# Patient Record
Sex: Male | Born: 1965 | Race: White | Hispanic: No | Marital: Single | Smoking: Current every day smoker
Health system: Southern US, Community
[De-identification: ages and names within clinical notes are randomized; demographics above are authoritative.]

## PROBLEM LIST (undated history)

## (undated) DIAGNOSIS — R519 Headache, unspecified: Secondary | ICD-10-CM

## (undated) DIAGNOSIS — R51 Headache: Secondary | ICD-10-CM

## (undated) DIAGNOSIS — G894 Chronic pain syndrome: Secondary | ICD-10-CM

## (undated) DIAGNOSIS — I10 Essential (primary) hypertension: Secondary | ICD-10-CM

## (undated) DIAGNOSIS — R569 Unspecified convulsions: Secondary | ICD-10-CM

## (undated) DIAGNOSIS — J45909 Unspecified asthma, uncomplicated: Secondary | ICD-10-CM

## (undated) HISTORY — PX: VASECTOMY: SHX75

## (undated) HISTORY — PX: NOSE SURGERY: SHX723

## (undated) HISTORY — PX: BACK SURGERY: SHX140

## (undated) HISTORY — PX: FOOT SURGERY: SHX648

## (undated) HISTORY — PX: OTHER SURGICAL HISTORY: SHX169

---

## 2016-12-22 ENCOUNTER — Encounter: Payer: Self-pay | Admitting: Student in an Organized Health Care Education/Training Program

## 2016-12-22 ENCOUNTER — Other Ambulatory Visit: Payer: Self-pay

## 2016-12-22 ENCOUNTER — Ambulatory Visit
Payer: Medicaid Other | Attending: Student in an Organized Health Care Education/Training Program | Admitting: Student in an Organized Health Care Education/Training Program

## 2016-12-22 VITALS — BP 113/69 | HR 69 | Temp 98.0°F | Ht 71.0 in | Wt 210.0 lb

## 2016-12-22 DIAGNOSIS — Z79899 Other long term (current) drug therapy: Secondary | ICD-10-CM | POA: Diagnosis not present

## 2016-12-22 DIAGNOSIS — M5136 Other intervertebral disc degeneration, lumbar region: Secondary | ICD-10-CM | POA: Diagnosis not present

## 2016-12-22 DIAGNOSIS — K219 Gastro-esophageal reflux disease without esophagitis: Secondary | ICD-10-CM | POA: Diagnosis not present

## 2016-12-22 DIAGNOSIS — G40909 Epilepsy, unspecified, not intractable, without status epilepticus: Secondary | ICD-10-CM | POA: Diagnosis not present

## 2016-12-22 DIAGNOSIS — M5442 Lumbago with sciatica, left side: Secondary | ICD-10-CM | POA: Diagnosis not present

## 2016-12-22 DIAGNOSIS — M5441 Lumbago with sciatica, right side: Secondary | ICD-10-CM

## 2016-12-22 DIAGNOSIS — F329 Major depressive disorder, single episode, unspecified: Secondary | ICD-10-CM | POA: Insufficient documentation

## 2016-12-22 DIAGNOSIS — J45909 Unspecified asthma, uncomplicated: Secondary | ICD-10-CM | POA: Insufficient documentation

## 2016-12-22 DIAGNOSIS — G8929 Other chronic pain: Secondary | ICD-10-CM

## 2016-12-22 DIAGNOSIS — G894 Chronic pain syndrome: Secondary | ICD-10-CM | POA: Diagnosis not present

## 2016-12-22 DIAGNOSIS — M545 Low back pain: Secondary | ICD-10-CM | POA: Diagnosis present

## 2016-12-22 MED ORDER — GABAPENTIN 600 MG PO TABS: 600.0000 mg | ORAL_TABLET | Freq: Four times a day (QID) | ORAL | 1 refills | Status: DC

## 2016-12-22 NOTE — Patient Instructions (Addendum)
1.  Increase gabapentin to 600 mg 4 times a day. 2.  We will obtain urine drug screen and routine labs today. 3.  Please obtain outside medical records from your pain physician in [place] and [place] and please have them faxed to our clinic.  Before we can definitively discuss treatment options and the treatment plan, I will need your previous procedural history which will include injections performed in your spine along with the medications that she had been on in the past.   You have been given a script for gabapentin today.  You have been  instructed to get labwork today.  Gabapentin capsules or tablets What is this medicine? GABAPENTIN (GA ba pen tin) is used to control partial seizures in adults with epilepsy. It is also used to treat certain types of nerve pain. This medicine may be used for other purposes; ask your health care provider or pharmacist if you have questions. COMMON BRAND NAME(S): Active-PAC with Gabapentin, Gabarone, Neurontin What should I tell my health care provider before I take this medicine? They need to know if you have any of these conditions: -kidney disease -suicidal thoughts, plans, or attempt; a previous suicide attempt by you or a family member -an unusual or allergic reaction to gabapentin, other medicines, foods, dyes, or preservatives -pregnant or trying to get pregnant -breast-feeding How should I use this medicine? Take this medicine by mouth with a glass of water. Follow the directions on the prescription label. You can take it with or without food. If it upsets your stomach, take it with food.Take your medicine at regular intervals. Do not take it more often than directed. Do not stop taking except on your doctor's advice. If you are directed to break the 600 or 800 mg tablets in half as part of your dose, the extra half tablet should be used for the next dose. If you have not used the extra half tablet within 28 days, it should be thrown away. A special  MedGuide will be given to you by the pharmacist with each prescription and refill. Be sure to read this information carefully each time. Talk to your pediatrician regarding the use of this medicine in children. Special care may be needed. Overdosage: If you think you have taken too much of this medicine contact a poison control center or emergency room at once. NOTE: This medicine is only for you. Do not share this medicine with others. What if I miss a dose? If you miss a dose, take it as soon as you can. If it is almost time for your next dose, take only that dose. Do not take double or extra doses. What may interact with this medicine? Do not take this medicine with any of the following medications: -other gabapentin products This medicine may also interact with the following medications: -alcohol -antacids -antihistamines for allergy, cough and cold -certain medicines for anxiety or sleep -certain medicines for depression or psychotic disturbances -homatropine; hydrocodone -naproxen -narcotic medicines (opiates) for pain -phenothiazines like chlorpromazine, mesoridazine, prochlorperazine, thioridazine This list may not describe all possible interactions. Give your health care provider a list of all the medicines, herbs, non-prescription drugs, or dietary supplements you use. Also tell them if you smoke, drink alcohol, or use illegal drugs. Some items may interact with your medicine. What should I watch for while using this medicine? Visit your doctor or health care professional for regular checks on your progress. You may want to keep a record at home of how you feel  your condition is responding to treatment. You may want to share this information with your doctor or health care professional at each visit. You should contact your doctor or health care professional if your seizures get worse or if you have any new types of seizures. Do not stop taking this medicine or any of your seizure  medicines unless instructed by your doctor or health care professional. Stopping your medicine suddenly can increase your seizures or their severity. Wear a medical identification bracelet or chain if you are taking this medicine for seizures, and carry a card that lists all your medications. You may get drowsy, dizzy, or have blurred vision. Do not drive, use machinery, or do anything that needs mental alertness until you know how this medicine affects you. To reduce dizzy or fainting spells, do not sit or stand up quickly, especially if you are an older patient. Alcohol can increase drowsiness and dizziness. Avoid alcoholic drinks. Your mouth may get dry. Chewing sugarless gum or sucking hard candy, and drinking plenty of water will help. The use of this medicine may increase the chance of suicidal thoughts or actions. Pay special attention to how you are responding while on this medicine. Any worsening of mood, or thoughts of suicide or dying should be reported to your health care professional right away. Women who become pregnant while using this medicine may enroll in the [place] Pregnancy Registry by calling [phone]. This registry collects information about the safety of antiepileptic drug use during pregnancy. What side effects may I notice from receiving this medicine? Side effects that you should report to your doctor or health care professional as soon as possible: -allergic reactions like skin rash, itching or hives, swelling of the face, lips, or tongue -worsening of mood, thoughts or actions of suicide or dying Side effects that usually do not require medical attention (report to your doctor or health care professional if they continue or are bothersome): -constipation -difficulty walking or controlling muscle movements -dizziness -nausea -slurred speech -tiredness -tremors -weight gain This list may not describe all possible side effects. Call your  doctor for medical advice about side effects. You may report side effects to FDA at 1-800-FDA-1088. Where should I keep my medicine? Keep out of reach of children. This medicine may cause accidental overdose and death if it taken by other adults, children, or pets. Mix any unused medicine with a substance like cat litter or coffee grounds. Then throw the medicine away in a sealed container like a sealed bag or a coffee can with a lid. Do not use the medicine after the expiration date. Store at room temperature between 15 and 30 degrees C (59 and 86 degrees F). NOTE: This sheet is a summary. It may not cover all possible information. If you have questions about this medicine, talk to your doctor, pharmacist, or health care provider.  2018 [employer] Standard (2013-02-18 15:26:50)

## 2016-12-22 NOTE — Progress Notes (Signed)
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 -Follow-up only after outside medical records from previous pain clinics have been faxed and scanned into our medical record.  Ordered Lab-work, Procedure(s), Referral(s), & Consult(s): Orders Placed This Encounter  Procedures  . Compliance Drug Analysis, Ur  . Comprehensive metabolic panel  . C-reactive protein  . Sedimentation rate  . Vitamin B12  . 25-Hydroxyvitamin D Lcms D2+D3  .  Magnesium   Pharmacotherapy (current): Medications ordered:  Meds ordered this encounter  Medications  . gabapentin (NEURONTIN) 600 MG tablet    Sig: Take 1 tablet (600 mg total) by mouth 4 (four) times daily.    Dispense:  120 tablet    Refill:  1    Do not place this medication, or any other prescription from our practice, on "Automatic Refill". Patient may have prescription filled one day early if pharmacy is closed on scheduled refill date.   Medications administered during this visit: Mathew Robertson had no medications administered during this visit.    Provider-requested follow-up: Return in about 5 weeks (around  01/26/2017).  Future Appointments  Date Time Provider [place]  01/22/2017 11:45 AM Gillis Santa, MD [facility] None    Primary Care Physician: [facility] [place] Location: [facility] Facility Note by: Gillis Santa, M.D, Date: 12/22/2016; Time: 1:16 PM  Patient Instructions  1.  Increase gabapentin to 600 mg 4 times a day. 2.  We will obtain urine drug screen and routine labs today. 3.  Please obtain outside medical records from your pain physician in [place] and [place] and please have them faxed to our clinic.  Before we can definitively discuss treatment options and the treatment plan, I will need your previous procedural history which will include injections performed in your spine along with the medications that she had been on in the past.   You have been given a script for gabapentin today.  You have been  instructed to get labwork today.  Gabapentin capsules or tablets What is this medicine? GABAPENTIN (GA ba pen tin) is used to control partial seizures in adults with epilepsy. It is also used to treat certain types of nerve pain. This medicine may be used for other purposes; ask your health care provider or pharmacist if you have questions. COMMON BRAND NAME(S): Active-PAC with Gabapentin, Gabarone, Neurontin What should I tell my health care provider before I take this medicine? They need to know if you have any of these conditions: -kidney disease -suicidal thoughts, plans, or attempt; a previous suicide attempt by you or a family member -an unusual or allergic reaction to gabapentin, other medicines, foods, dyes, or preservatives -pregnant or trying to get pregnant -breast-feeding How should I use this medicine? Take this medicine by mouth with a glass of water. Follow the directions on the prescription label. You can take it with or without food. If it upsets your stomach, take it with food.Take your medicine at regular  intervals. Do not take it more often than directed. Do not stop taking except on your doctor's advice. If you are directed to break the 600 or 800 mg tablets in half as part of your dose, the extra half tablet should be used for the next dose. If you have not used the extra half tablet within 28 days, it should be thrown away. A special MedGuide will be given to you by the pharmacist with each prescription and refill. Be sure to read this information carefully each time. Talk to your pediatrician regarding the use of this medicine in children. Special care may be needed. Overdosage: If you think you have taken too much of this medicine contact a poison control center or emergency room at once. NOTE: This medicine is only for you. Do not share this medicine with others. What if I miss a dose? If you miss a dose, take it as soon as you can. If it is almost  time for your next dose, take only that dose. Do not take double or extra doses. What may interact with this medicine? Do not take this medicine with any of the following medications: -other gabapentin products This medicine may also interact with the following medications: -alcohol -antacids -antihistamines for allergy, cough and cold -certain medicines for anxiety or sleep -certain medicines for depression or psychotic disturbances -homatropine; hydrocodone -naproxen -narcotic medicines (opiates) for pain -phenothiazines like chlorpromazine, mesoridazine, prochlorperazine, thioridazine This list may not describe all possible interactions. Give your health care provider a list of all the medicines, herbs, non-prescription drugs, or dietary supplements you use. Also tell them if you smoke, drink alcohol, or use illegal drugs. Some items may interact with your medicine. What should I watch for while using this medicine? Visit your doctor or health care professional for regular checks on your progress. You may want to keep a record at home of  how you feel your condition is responding to treatment. You may want to share this information with your doctor or health care professional at each visit. You should contact your doctor or health care professional if your seizures get worse or if you have any new types of seizures. Do not stop taking this medicine or any of your seizure medicines unless instructed by your doctor or health care professional. Stopping your medicine suddenly can increase your seizures or their severity. Wear a medical identification bracelet or chain if you are taking this medicine for seizures, and carry a card that lists all your medications. You may get drowsy, dizzy, or have blurred vision. Do not drive, use machinery, or do anything that needs mental alertness until you know how this medicine affects you. To reduce dizzy or fainting spells, do not sit or stand up quickly, especially if you are an older patient. Alcohol can increase drowsiness and dizziness. Avoid alcoholic drinks. Your mouth may get dry. Chewing sugarless gum or sucking hard candy, and drinking plenty of water will help. The use of this medicine may increase the chance of suicidal thoughts or actions. Pay special attention to how you are responding while on this medicine. Any worsening of mood, or thoughts of suicide or dying should be reported to your health care professional right away. Women who become pregnant while using this medicine may enroll in the [place] Pregnancy Registry by calling [phone]. This registry collects information about the safety of antiepileptic drug use during pregnancy. What side effects may I notice from receiving this medicine? Side effects that you should report to your doctor or health care professional as soon as possible: -allergic reactions like skin rash, itching or hives, swelling of the face, lips, or tongue -worsening of mood, thoughts or actions of suicide or dying Side effects that  usually do not require medical attention (report to your doctor or health care professional if they continue or are bothersome): -constipation -difficulty walking or controlling muscle movements -dizziness -nausea -slurred speech -tiredness -tremors -weight gain This list may not describe all possible side effects. Call your doctor for medical advice about side effects. You may report side effects to FDA at 1-800-FDA-1088. Where should I keep my medicine? Keep out of reach of children. This medicine may cause accidental overdose and death if it taken by other adults, children, or pets. Mix any unused medicine with a substance like cat litter or coffee grounds. Then throw the medicine away in a sealed container like a sealed bag or a coffee can with a  lid. Do not use the medicine after the expiration date. Store at room temperature between 15 and 30 degrees C (59 and 86 degrees F). NOTE: This sheet is a summary. It may not cover all possible information. If you have questions about this medicine, talk to your doctor, pharmacist, or health care provider.  2018 Elsevier/Gold Standard (2013-02-18 15:26:50)

## 2016-12-23 ENCOUNTER — Telehealth: Payer: Self-pay

## 2016-12-23 DIAGNOSIS — M5442 Lumbago with sciatica, left side: Secondary | ICD-10-CM

## 2016-12-23 DIAGNOSIS — M5441 Lumbago with sciatica, right side: Secondary | ICD-10-CM

## 2016-12-23 DIAGNOSIS — G894 Chronic pain syndrome: Secondary | ICD-10-CM | POA: Insufficient documentation

## 2016-12-23 DIAGNOSIS — M5136 Other intervertebral disc degeneration, lumbar region: Secondary | ICD-10-CM | POA: Insufficient documentation

## 2016-12-23 DIAGNOSIS — M51369 Other intervertebral disc degeneration, lumbar region without mention of lumbar back pain or lower extremity pain: Secondary | ICD-10-CM | POA: Insufficient documentation

## 2016-12-23 DIAGNOSIS — G8929 Other chronic pain: Secondary | ICD-10-CM | POA: Insufficient documentation

## 2016-12-23 NOTE — Telephone Encounter (Signed)
Patient called and said Dr. Holley Raring wanted him to sign consents to get records from his previous doctor. He said he didn't sign consents here and wanted [place] to mail them. I told him we could not do that, he needs to come back and sign consents at his convenience. He agreed to do so.

## 2016-12-26 LAB — 25-HYDROXY VITAMIN D LCMS D2+D3
25-Hydroxy, Vitamin D-2: <1 ng/mL
25-Hydroxy, Vitamin D-3: 25 ng/mL

## 2016-12-26 LAB — COMPREHENSIVE METABOLIC PANEL
A/G RATIO: 1.8 (ref 1.2–2.2)
ALBUMIN: 4.8 g/dL (ref 3.5–5.5)
ALK PHOS: 115 IU/L (ref 39–117)
ALT: 12 IU/L (ref 0–44)
AST: 16 IU/L (ref 0–40)
BILIRUBIN TOTAL: 0.2 mg/dL (ref 0.0–1.2)
BUN / CREAT RATIO: 20 (ref 9–20)
BUN: 22 mg/dL (ref 6–24)
CHLORIDE: 104 mmol/L (ref 96–106)
CO2: 23 mmol/L (ref 20–29)
Calcium: 9.1 mg/dL (ref 8.7–10.2)
Creatinine, Ser: 1.12 mg/dL (ref 0.76–1.27)
GFR calc Af Amer: 87 mL/min/{1.73_m2} (ref >59)
GFR calc non Af Amer: 76 mL/min/{1.73_m2} (ref >59)
GLUCOSE: 96 mg/dL (ref 65–99)
Globulin, Total: 2.7 g/dL (ref 1.5–4.5)
POTASSIUM: 3.8 mmol/L (ref 3.5–5.2)
Sodium: 146 mmol/L — ABNORMAL HIGH (ref 134–144)
Total Protein: 7.5 g/dL (ref 6.0–8.5)

## 2016-12-26 LAB — MAGNESIUM: MAGNESIUM: 2.2 mg/dL (ref 1.6–2.3)

## 2016-12-26 LAB — 25-HYDROXYVITAMIN D LCMS D2+D3: 25-HYDROXY, VITAMIN D: 25 ng/mL — AB

## 2016-12-26 LAB — COMPLIANCE DRUG ANALYSIS, UR

## 2016-12-26 LAB — C-REACTIVE PROTEIN: CRP: 3.7 mg/L (ref 0.0–4.9)

## 2016-12-26 LAB — SEDIMENTATION RATE: Sed Rate: 12 mm/hr (ref 0–30)

## 2016-12-26 LAB — VITAMIN B12: Vitamin B-12: 280 pg/mL (ref 232–1245)

## 2017-01-15 ENCOUNTER — Ambulatory Visit: Payer: Medicaid Other | Attending: Neurology

## 2017-01-15 DIAGNOSIS — G4700 Insomnia, unspecified: Secondary | ICD-10-CM | POA: Diagnosis present

## 2017-01-15 DIAGNOSIS — G4733 Obstructive sleep apnea (adult) (pediatric): Secondary | ICD-10-CM | POA: Diagnosis not present

## 2017-01-22 ENCOUNTER — Encounter: Payer: Medicaid Other | Admitting: Student in an Organized Health Care Education/Training Program

## 2017-02-24 ENCOUNTER — Ambulatory Visit
Payer: Medicaid Other | Attending: Student in an Organized Health Care Education/Training Program | Admitting: Student in an Organized Health Care Education/Training Program

## 2017-06-05 ENCOUNTER — Other Ambulatory Visit: Payer: Self-pay

## 2017-06-05 ENCOUNTER — Ambulatory Visit: Payer: Medicaid Other | Admitting: Anesthesiology

## 2017-06-05 ENCOUNTER — Encounter: Payer: Self-pay | Admitting: *Deleted

## 2017-06-05 ENCOUNTER — Ambulatory Visit
Admission: RE | Admit: 2017-06-05 | Discharge: 2017-06-05 | Disposition: A | Discharge status: Home / Self Care | Payer: Medicaid Other | Source: Ambulatory Visit | Attending: Unknown Physician Specialty | Admitting: Unknown Physician Specialty

## 2017-06-05 ENCOUNTER — Encounter
Admission: RE | Disposition: A | Discharge status: Home / Self Care | Payer: Self-pay | Source: Ambulatory Visit | Attending: Unknown Physician Specialty

## 2017-06-05 DIAGNOSIS — Z7982 Long term (current) use of aspirin: Secondary | ICD-10-CM | POA: Diagnosis not present

## 2017-06-05 DIAGNOSIS — Z882 Allergy status to sulfonamides status: Secondary | ICD-10-CM | POA: Diagnosis not present

## 2017-06-05 DIAGNOSIS — K766 Portal hypertension: Secondary | ICD-10-CM | POA: Insufficient documentation

## 2017-06-05 DIAGNOSIS — Z13810 Encounter for screening for upper gastrointestinal disorder: Secondary | ICD-10-CM | POA: Insufficient documentation

## 2017-06-05 DIAGNOSIS — F17210 Nicotine dependence, cigarettes, uncomplicated: Secondary | ICD-10-CM | POA: Diagnosis not present

## 2017-06-05 DIAGNOSIS — Z7951 Long term (current) use of inhaled steroids: Secondary | ICD-10-CM | POA: Diagnosis not present

## 2017-06-05 DIAGNOSIS — R51 Headache: Secondary | ICD-10-CM | POA: Insufficient documentation

## 2017-06-05 DIAGNOSIS — Z1211 Encounter for screening for malignant neoplasm of colon: Secondary | ICD-10-CM | POA: Insufficient documentation

## 2017-06-05 DIAGNOSIS — J449 Chronic obstructive pulmonary disease, unspecified: Secondary | ICD-10-CM | POA: Insufficient documentation

## 2017-06-05 DIAGNOSIS — Z8371 Family history of colonic polyps: Secondary | ICD-10-CM | POA: Diagnosis present

## 2017-06-05 DIAGNOSIS — R569 Unspecified convulsions: Secondary | ICD-10-CM | POA: Insufficient documentation

## 2017-06-05 DIAGNOSIS — Z79899 Other long term (current) drug therapy: Secondary | ICD-10-CM | POA: Insufficient documentation

## 2017-06-05 DIAGNOSIS — Z9852 Vasectomy status: Secondary | ICD-10-CM | POA: Insufficient documentation

## 2017-06-05 DIAGNOSIS — B9681 Helicobacter pylori [H. pylori] as the cause of diseases classified elsewhere: Secondary | ICD-10-CM | POA: Insufficient documentation

## 2017-06-05 DIAGNOSIS — Z888 Allergy status to other drugs, medicaments and biological substances status: Secondary | ICD-10-CM | POA: Insufficient documentation

## 2017-06-05 DIAGNOSIS — D122 Benign neoplasm of ascending colon: Secondary | ICD-10-CM | POA: Diagnosis not present

## 2017-06-05 DIAGNOSIS — K2950 Unspecified chronic gastritis without bleeding: Secondary | ICD-10-CM | POA: Diagnosis not present

## 2017-06-05 DIAGNOSIS — I10 Essential (primary) hypertension: Secondary | ICD-10-CM | POA: Diagnosis not present

## 2017-06-05 DIAGNOSIS — K640 First degree hemorrhoids: Secondary | ICD-10-CM | POA: Diagnosis not present

## 2017-06-05 HISTORY — DX: Unspecified asthma, uncomplicated: J45.909

## 2017-06-05 HISTORY — DX: Essential (primary) hypertension: I10

## 2017-06-05 HISTORY — DX: Headache: R51

## 2017-06-05 HISTORY — PX: COLONOSCOPY WITH PROPOFOL: SHX5780

## 2017-06-05 HISTORY — PX: ESOPHAGOGASTRODUODENOSCOPY (EGD) WITH PROPOFOL: SHX5813

## 2017-06-05 HISTORY — DX: Unspecified convulsions: R56.9

## 2017-06-05 HISTORY — DX: Headache, unspecified: R51.9

## 2017-06-05 HISTORY — DX: Chronic pain syndrome: G89.4

## 2017-06-05 SURGERY — ESOPHAGOGASTRODUODENOSCOPY (EGD) WITH PROPOFOL
Anesthesia: General

## 2017-06-05 MED ORDER — PROPOFOL 500 MG/50ML IV EMUL
INTRAVENOUS | Status: AC
  Filled 2017-06-05: qty 50

## 2017-06-05 MED ORDER — PROPOFOL 500 MG/50ML IV EMUL
INTRAVENOUS | Status: DC | PRN
  Administered 2017-06-05: 50 ug/kg/min via INTRAVENOUS

## 2017-06-05 MED ORDER — SODIUM CHLORIDE 0.9 % IV SOLN
INTRAVENOUS | Status: DC
  Administered 2017-06-05: 10:00:00 via INTRAVENOUS

## 2017-06-05 MED ORDER — GLYCOPYRROLATE 0.2 MG/ML IJ SOLN
INTRAMUSCULAR | Status: AC
  Filled 2017-06-05: qty 1

## 2017-06-05 MED ORDER — FENTANYL CITRATE (PF) 100 MCG/2ML IJ SOLN
INTRAMUSCULAR | Status: AC
  Filled 2017-06-05: qty 2

## 2017-06-05 MED ORDER — LIDOCAINE HCL (PF) 2 % IJ SOLN
INTRAMUSCULAR | Status: AC
  Filled 2017-06-05: qty 10

## 2017-06-05 MED ORDER — SODIUM CHLORIDE 0.9 % IV SOLN: INTRAVENOUS | Status: DC

## 2017-06-05 MED ORDER — PHENYLEPHRINE HCL 10 MG/ML IJ SOLN
INTRAMUSCULAR | Status: DC | PRN
  Administered 2017-06-05: 200 ug via INTRAVENOUS
  Administered 2017-06-05 (×2): 100 ug via INTRAVENOUS
  Administered 2017-06-05: 400 ug via INTRAVENOUS
  Administered 2017-06-05: 200 ug via INTRAVENOUS

## 2017-06-05 MED ORDER — MIDAZOLAM HCL 5 MG/5ML IJ SOLN
INTRAMUSCULAR | Status: DC | PRN
  Administered 2017-06-05: 2 mg via INTRAVENOUS

## 2017-06-05 MED ORDER — EPHEDRINE SULFATE 50 MG/ML IJ SOLN
INTRAMUSCULAR | Status: DC | PRN
  Administered 2017-06-05: 5 mg via INTRAVENOUS
  Administered 2017-06-05 (×2): 10 mg via INTRAVENOUS
  Administered 2017-06-05: 5 mg via INTRAVENOUS
  Administered 2017-06-05 (×2): 10 mg via INTRAVENOUS

## 2017-06-05 MED ORDER — GLYCOPYRROLATE 0.2 MG/ML IJ SOLN
INTRAMUSCULAR | Status: DC | PRN
  Administered 2017-06-05 (×2): 0.2 mg via INTRAVENOUS

## 2017-06-05 MED ORDER — FENTANYL CITRATE (PF) 100 MCG/2ML IJ SOLN
INTRAMUSCULAR | Status: DC | PRN
  Administered 2017-06-05 (×2): 50 ug via INTRAVENOUS

## 2017-06-05 MED ORDER — IPRATROPIUM-ALBUTEROL 0.5-2.5 (3) MG/3ML IN SOLN
RESPIRATORY_TRACT | Status: AC
  Administered 2017-06-05: 3 mL
  Filled 2017-06-05: qty 3

## 2017-06-05 MED ORDER — MIDAZOLAM HCL 2 MG/2ML IJ SOLN
INTRAMUSCULAR | Status: AC
  Filled 2017-06-05: qty 2

## 2017-06-05 MED ORDER — PROPOFOL 10 MG/ML IV BOLUS
INTRAVENOUS | Status: DC | PRN
  Administered 2017-06-05: 20 mg via INTRAVENOUS
  Administered 2017-06-05: 30 mg via INTRAVENOUS

## 2017-06-05 MED ORDER — LIDOCAINE HCL (PF) 2 % IJ SOLN
INTRAMUSCULAR | Status: DC | PRN
  Administered 2017-06-05: 80 mg

## 2017-06-05 NOTE — Anesthesia Preprocedure Evaluation (Signed)
Anesthesia Evaluation  Patient identified by MRN, date of birth, ID band Patient awake    Reviewed: Allergy & Precautions, H&P , NPO status , Patient's Chart, lab work & pertinent test results  History of Anesthesia Complications (+) history of anesthetic complications (Code with back surgery)  Airway Mallampati: III  TM Distance: >3 FB Neck ROM: limited    Dental  (+) Chipped, Poor Dentition, Missing   Pulmonary neg shortness of breath, asthma , COPD, Current Smoker,           Cardiovascular Exercise Tolerance: Poor hypertension,      Neuro/Psych  Headaches, Seizures -, Well Controlled,   Neuromuscular disease CVA, Residual Symptoms negative psych ROS   GI/Hepatic negative GI ROS, Neg liver ROS, neg GERD  ,  Endo/Other  negative endocrine ROS  Renal/GU negative Renal ROS  negative genitourinary   Musculoskeletal  (+) Arthritis ,   Abdominal   Peds  Hematology negative hematology ROS (+)   Anesthesia Other Findings Past Medical History: No date: Asthma No date: Chronic pain disorder No date: Headache No date: Hypertension No date: Seizures ([place])  Past Surgical History: No date: BACK SURGERY No date: cosmetic eye surgery No date: FOOT SURGERY No date: NOSE SURGERY No date: VASECTOMY  BMI    Body Mass Index:  29.29 kg/m      Reproductive/Obstetrics negative OB ROS                             Anesthesia Physical Anesthesia Plan  ASA: III  Anesthesia Plan: General   Post-op Pain Management:    Induction: Intravenous  PONV Risk Score and Plan: Propofol infusion and TIVA  Airway Management Planned: Natural Airway and Nasal Cannula  Additional Equipment:   Intra-op Plan:   Post-operative Plan:   Informed Consent: I have reviewed the patients History and Physical, chart, labs and discussed the procedure including the risks, benefits and alternatives for the proposed  anesthesia with the patient or authorized representative who has indicated his/her understanding and acceptance.   Dental Advisory Given  Plan Discussed with: Anesthesiologist, CRNA and Surgeon  Anesthesia Plan Comments: (Patient consented for risks of anesthesia including but not limited to:  - adverse reactions to medications - risk of intubation if required - damage to teeth, lips or other oral mucosa - sore throat or hoarseness - Damage to heart, brain, lungs or loss of life  Patient voiced understanding.)        Anesthesia Quick Evaluation

## 2017-06-05 NOTE — H&P (Signed)
Primary Care Physician:  [facility], [facility] Primary Gastroenterologist:  Dr. Vira Agar  Pre-Procedure History & Physical: HPI:  Mathew Robertson is a 52 y.o. male is here for an endoscopy and colonoscopy.FH of colon polyps in mother and GERD.   Past Medical History:  Diagnosis Date  . Asthma   . Chronic pain disorder   . Headache   . Hypertension   . Seizures ([place])     Past Surgical History:  Procedure Laterality Date  . BACK SURGERY    . cosmetic eye surgery    . FOOT SURGERY    . NOSE SURGERY    . VASECTOMY      Prior to Admission medications   Medication Sig Start Date End Date Taking? Authorizing Provider  acetaminophen-codeine (TYLENOL #3) 300-30 MG tablet Take 1 tablet by mouth every 8 (eight) hours as needed. 12/17/16  Yes [provider]  ADVAIR DISKUS 250-50 MCG/DOSE AEPB Take 1 puff by mouth as needed. 12/16/16  Yes [provider]  ALPRAZolam Duanne Moron) 1 MG tablet Take 1 tablet by mouth 3 (three) times daily. 12/06/16  Yes [provider]  ASPIRIN LOW DOSE 81 MG EC tablet Take 1 tablet by mouth daily. 11/26/16  Yes [provider]  gabapentin (NEURONTIN) 600 MG tablet Take 1 tablet (600 mg total) by mouth 4 (four) times daily. 12/22/16  Yes Gillis Santa, MD  hydrochlorothiazide (HYDRODIURIL) 25 MG tablet Take 1 tablet by mouth daily. 11/26/16  Yes [provider]  nortriptyline (PAMELOR) 25 MG capsule Take 2 capsules by mouth at bedtime as needed. 11/26/16  Yes [provider]  Oxcarbazepine (TRILEPTAL) 300 MG tablet Take 300 mg by mouth 2 (two) times daily.   Yes [provider]  PROAIR HFA 108 (90 Base) MCG/ACT inhaler Take 2 puffs by mouth 4 (four) times daily as needed. 11/26/16  Yes [provider]  topiramate (TOPAMAX) 50 MG tablet Take 100 mg by mouth 2 (two) times daily. 1 tablet in the am and 2 tablets nightly. 11/26/16  Yes [provider]  VIMPAT 200 MG TABS tablet  Take 1 tablet by mouth 2 (two) times daily. 12/01/16  Yes [provider]    Allergies as of 04/29/2017 - Review Complete 12/22/2016  Allergen Reaction Noted  . Dilantin [phenytoin]  12/22/2016  . Sulfa antibiotics  12/22/2016    Family History  Problem Relation Age of Onset  . Arthritis Mother   . Cancer Mother   . Mental illness Mother     Social History   Socioeconomic History  . Marital status: Single    Spouse name: Not on file  . Number of children: Not on file  . Years of education: Not on file  . Highest education level: Not on file  Occupational History  . Not on file  Social Needs  . Financial resource strain: Not on file  . Food insecurity:    Worry: Not on file    Inability: Not on file  . Transportation needs:    Medical: Not on file    Non-medical: Not on file  Tobacco Use  . Smoking status: Current Every Day Smoker    Packs/day: 0.25    Years: 20.00    Pack years: 5.00    Types: Cigarettes  . Smokeless tobacco: Never Used  Substance and Sexual Activity  . Alcohol use: Not Currently  . Drug use: Never  . Sexual activity: Not on file  Lifestyle  . Physical activity:  Days per week: Not on file    Minutes per session: Not on file  . Stress: Not on file  Relationships  . Social connections:    Talks on phone: Not on file    Gets together: Not on file    Attends religious service: Not on file    Active member of club or organization: Not on file    Attends meetings of clubs or organizations: Not on file    Relationship status: Not on file  . Intimate partner violence:    Fear of current or ex partner: Not on file    Emotionally abused: Not on file    Physically abused: Not on file    Forced sexual activity: Not on file  Other Topics Concern  . Not on file  Social History Narrative  . Not on file    Review of Systems: See HPI, otherwise negative ROS  Physical Exam: BP 123/79   Pulse (!) 52   Temp (!) 97.2 F (36.2 C)  (Tympanic)   Resp 18   Ht 5\' 11"  (1.803 m)   Wt 95.3 kg (210 lb)   SpO2 98%   BMI 29.29 kg/m  General:   Alert,  pleasant and cooperative in NAD Head:  Normocephalic and atraumatic. Neck:  Supple; no masses or thyromegaly. Lungs:  Clear throughout to auscultation.    Heart:  Regular rate and rhythm. Abdomen:  Soft, nontender and nondistended. Normal bowel sounds, without guarding, and without rebound.   Neurologic:  Alert and  oriented x4;  grossly normal neurologically.  Impression/Plan: Mathew Robertson is here for an endoscopy and colonoscopy to be performed for FH colon polyps in mother and GERD.  Risks, benefits, limitations, and alternatives regarding  endoscopy and colonoscopy have been reviewed with the patient.  Questions have been answered.  All parties agreeable.   Gaylyn Cheers, MD  06/05/2017, 10:33 AM

## 2017-06-05 NOTE — Transfer of Care (Signed)
Immediate Anesthesia Transfer of Care Note  Patient: Mathew Robertson  Procedure(s) Performed: ESOPHAGOGASTRODUODENOSCOPY (EGD) WITH PROPOFOL (N/A ) COLONOSCOPY WITH PROPOFOL (N/A )  Patient Location: PACU  Anesthesia Type:General  Level of Consciousness: sedated  Airway & Oxygen Therapy: Patient Spontanous Breathing and Patient connected to nasal cannula oxygen  Post-op Assessment: Report given to RN and Post -op Vital signs reviewed and stable  Post vital signs: Reviewed and stable  Last Vitals:  Vitals Value Taken Time  BP 119/79 06/05/2017 11:20 AM  Temp 36 C 06/05/2017 11:10 AM  Pulse 62 06/05/2017 11:21 AM  Resp 19 06/05/2017 11:21 AM  SpO2 96 % 06/05/2017 11:21 AM  Vitals shown include unvalidated device data.  Last Pain:  Vitals:   06/05/17 1110  TempSrc: Tympanic  PainSc:          Complications: No apparent anesthesia complications

## 2017-06-05 NOTE — Anesthesia Post-op Follow-up Note (Signed)
Anesthesia QCDR form completed.        

## 2017-06-05 NOTE — Anesthesia Postprocedure Evaluation (Signed)
Anesthesia Post Note  Patient: Luian Schumpert  Procedure(s) Performed: ESOPHAGOGASTRODUODENOSCOPY (EGD) WITH PROPOFOL (N/A ) COLONOSCOPY WITH PROPOFOL (N/A )  Patient location during evaluation: Endoscopy Anesthesia Type: General Level of consciousness: awake and alert Pain management: pain level controlled Vital Signs Assessment: post-procedure vital signs reviewed and stable Respiratory status: spontaneous breathing, nonlabored ventilation, respiratory function stable and patient connected to nasal cannula oxygen Cardiovascular status: blood pressure returned to baseline and stable Postop Assessment: no apparent nausea or vomiting Anesthetic complications: no     Last Vitals:  Vitals:   06/05/17 1110 06/05/17 1120  BP:  119/79  Pulse: 67 65  Resp: 19 18  Temp: (!) 36 C   SpO2: 96% 96%    Last Pain:  Vitals:   06/05/17 1110  TempSrc: Tympanic  PainSc:                  Precious Haws Piscitello

## 2017-06-05 NOTE — Op Note (Signed)
[facility] Gastroenterology Patient Name: Mathew Robertson Procedure Date: 06/05/2017 10:35 AM MRN: [id number] Account #: [id number] Date of Birth: [birth year] Admit Type: Outpatient Age: 52 Room: [facility] ENDO ROOM 1 Gender: Male Note Status: Finalized Procedure:            Colonoscopy Indications:          Colon cancer screening in patient at increased risk:                        Family history of 1st-degree relative with colon polyps Providers:            Manya Silvas, MD Referring MD:         No Local Md, MD (Referring MD) Medicines:            Propofol per Anesthesia Complications:        No immediate complications. Procedure:            Pre-Anesthesia Assessment:                       - After reviewing the risks and benefits, the patient                        was deemed in satisfactory condition to undergo the                        procedure.                       After obtaining informed consent, the colonoscope was                        passed under direct vision. Throughout the procedure,                        the patient's blood pressure, pulse, and oxygen                        saturations were monitored continuously. The                        Colonoscope was introduced through the anus and                        advanced to the the cecum, identified by appendiceal                        orifice and ileocecal valve. The colonoscopy was                        performed without difficulty. The patient tolerated the                        procedure well. The quality of the bowel preparation                        was excellent. Findings:      A diminutive polyp was found in the ascending colon. The polyp was       sessile. The polyp was removed with a jumbo cold forceps. Resection and       retrieval were complete.  Internal hemorrhoids were found during endoscopy. The hemorrhoids were       small and Grade I (internal hemorrhoids that do not  prolapse).      The exam was otherwise without abnormality. Impression:           - One diminutive polyp in the ascending colon, removed                        with a jumbo cold forceps. Resected and retrieved.                       - Internal hemorrhoids.                       - The examination was otherwise normal. Recommendation:       - Await pathology results. Manya Silvas, MD 06/05/2017 11:17:57 AM This report has been signed electronically. Number of Addenda: 0 Note Initiated On: 06/05/2017 10:35 AM Scope Withdrawal Time: 0 hours 11 minutes 11 seconds  Total Procedure Duration: 0 hours 15 minutes 50 seconds       [facility]

## 2017-06-05 NOTE — Op Note (Signed)
[facility] Gastroenterology Patient Name: Mathew Robertson Procedure Date: 06/05/2017 10:36 AM MRN: [id number] Account #: [id number] Date of Birth: March 05, 1965 Admit Type: Outpatient Age: 52 Room: [facility] ENDO ROOM 1 Gender: Male Note Status: Finalized Procedure:            Upper GI endoscopy Indications:          Suspected gastro-esophageal reflux disease Providers:            Manya Silvas, MD Referring MD:         No Local Md, MD (Referring MD) Medicines:            Propofol per Anesthesia Complications:        No immediate complications. Procedure:            Pre-Anesthesia Assessment:                       - After reviewing the risks and benefits, the patient                        was deemed in satisfactory condition to undergo the                        procedure.                       After obtaining informed consent, the endoscope was                        passed under direct vision. Throughout the procedure,                        the patient's blood pressure, pulse, and oxygen                        saturations were monitored continuously. The Endoscope                        was introduced through the mouth, and advanced to the                        second part of duodenum. The upper GI endoscopy was                        accomplished without difficulty. The patient tolerated                        the procedure well. Findings:      The examined esophagus was normal. GEJ 44cm from teeth.      Diffuse moderate inflammation characterized by congestion (edema),       erosions, erythema and granularity was found in the gastric body. This       is portal hypertensive gastrophathy.      Patchy mild inflammation characterized by erythema and granularity was       found in the gastric antrum. Biopsies were taken with a cold forceps for       histology. Biopsies were taken with a cold forceps for Helicobacter       pylori testing.      The examined duodenum  was normal. Impression:           - Normal esophagus.                       -  Gastritis.                       - Gastritis. Biopsied.                       - Normal examined duodenum. Recommendation:       - Await pathology results. Manya Silvas, MD 06/05/2017 10:56:16 AM This report has been signed electronically. Number of Addenda: 0 Note Initiated On: 06/05/2017 10:36 AM      [facility]

## 2017-06-08 ENCOUNTER — Encounter: Payer: Self-pay | Admitting: Unknown Physician Specialty

## 2017-06-08 LAB — SURGICAL PATHOLOGY

## 2017-06-30 ENCOUNTER — Encounter: Payer: Self-pay | Admitting: Emergency Medicine

## 2017-06-30 ENCOUNTER — Emergency Department
Admission: EM | Admit: 2017-06-30 | Discharge: 2017-06-30 | Disposition: A | Discharge status: Home / Self Care | Payer: Medicaid Other | Attending: Emergency Medicine | Admitting: Emergency Medicine

## 2017-06-30 ENCOUNTER — Other Ambulatory Visit: Payer: Self-pay

## 2017-06-30 DIAGNOSIS — Z5321 Procedure and treatment not carried out due to patient leaving prior to being seen by health care provider: Secondary | ICD-10-CM | POA: Insufficient documentation

## 2017-06-30 DIAGNOSIS — R51 Headache: Secondary | ICD-10-CM | POA: Diagnosis not present

## 2017-06-30 NOTE — ED Triage Notes (Signed)
Pt ambulatory to triage with no difficulty. Pt reports hx of seizures. Last seizure was in Feb of 2018. Pt has been taking his medications as prescribed. Tonight bout 2 hours ago he developed a headache. Took nortriptyline as he normally does but has not help. Followed by Dr Melrose Nakayama at [facility] who has told him not to take any OTC meds for headaches. Pt talking in full and complete sentences with no difficulty.

## 2017-08-09 ENCOUNTER — Encounter: Payer: Self-pay | Admitting: Emergency Medicine

## 2017-08-09 ENCOUNTER — Other Ambulatory Visit: Payer: Self-pay

## 2017-08-09 ENCOUNTER — Emergency Department
Admission: EM | Admit: 2017-08-09 | Discharge: 2017-08-09 | Disposition: A | Discharge status: Home / Self Care | Payer: Medicaid Other | Attending: Emergency Medicine | Admitting: Emergency Medicine

## 2017-08-09 DIAGNOSIS — M7918 Myalgia, other site: Secondary | ICD-10-CM | POA: Insufficient documentation

## 2017-08-09 DIAGNOSIS — M542 Cervicalgia: Secondary | ICD-10-CM | POA: Diagnosis present

## 2017-08-09 DIAGNOSIS — F17210 Nicotine dependence, cigarettes, uncomplicated: Secondary | ICD-10-CM | POA: Diagnosis not present

## 2017-08-09 DIAGNOSIS — I10 Essential (primary) hypertension: Secondary | ICD-10-CM | POA: Insufficient documentation

## 2017-08-09 DIAGNOSIS — J45909 Unspecified asthma, uncomplicated: Secondary | ICD-10-CM | POA: Diagnosis not present

## 2017-08-09 DIAGNOSIS — Z79899 Other long term (current) drug therapy: Secondary | ICD-10-CM | POA: Insufficient documentation

## 2017-08-09 MED ORDER — METHOCARBAMOL 500 MG PO TABS
500.0000 mg | ORAL_TABLET | Freq: Once | ORAL | Status: AC
  Administered 2017-08-09: 500 mg via ORAL
  Filled 2017-08-09: qty 1

## 2017-08-09 MED ORDER — KETOROLAC TROMETHAMINE 30 MG/ML IJ SOLN
30.0000 mg | Freq: Once | INTRAMUSCULAR | Status: AC
  Administered 2017-08-09: 30 mg via INTRAMUSCULAR
  Filled 2017-08-09: qty 1

## 2017-08-09 MED ORDER — METHOCARBAMOL 500 MG PO TABS: 500.0000 mg | ORAL_TABLET | Freq: Four times a day (QID) | ORAL | 0 refills | Status: DC

## 2017-08-09 MED ORDER — NAPROXEN 500 MG PO TABS: 500.0000 mg | ORAL_TABLET | Freq: Two times a day (BID) | ORAL | 0 refills | Status: DC

## 2017-08-09 NOTE — Discharge Instructions (Signed)
Keep your scheduled appointment on the eighth with your doctor.  You may use ice or heat to your neck as needed for discomfort.  Continue taking methocarbamol 500 mg 4 times daily for muscle spasms and naproxen 500 mg twice daily with food.  Continue your regular medication as prescribed by your doctor.

## 2017-08-09 NOTE — ED Provider Notes (Signed)
[facility] Emergency Department Provider Note  ___________________________________________   First MD Initiated Contact with Patient 08/09/17 [phone]     (approximate)  I have reviewed the triage vital signs and the nursing notes.   HISTORY  Chief Complaint Neck Pain  HPI Mathew Robertson is a 52 y.o. male is here with complaint of neck and upper back pain for the last 2 weeks.  Patient has history of chronic low back pain with sciatica.  He states that he also sees a pain management doctor, neurologist and his primary care provider.  Does not injury to his neck.  Currently the only medication he is on for pain has been gabapentin which is not helping.  He denies any paresthesias into his upper or lower extremities at this time.   Past Medical History:  Diagnosis Date  . Asthma   . Chronic pain disorder   . Headache   . Hypertension   . Seizures [facility])     Patient Active Problem List   Diagnosis Date Noted  . Chronic bilateral low back pain with bilateral sciatica 12/23/2016  . Lumbar degenerative disc disease 12/23/2016  . Chronic pain syndrome 12/23/2016    Past Surgical History:  Procedure Laterality Date  . BACK SURGERY    . COLONOSCOPY WITH PROPOFOL N/A 06/05/2017   Procedure: COLONOSCOPY WITH PROPOFOL;  Surgeon: Manya Silvas, MD;  Location: [facility] ENDOSCOPY;  Service: Endoscopy;  Laterality: N/A;  . cosmetic eye surgery    . ESOPHAGOGASTRODUODENOSCOPY (EGD) WITH PROPOFOL N/A 06/05/2017   Procedure: ESOPHAGOGASTRODUODENOSCOPY (EGD) WITH PROPOFOL;  Surgeon: Manya Silvas, MD;  Location: [facility] ENDOSCOPY;  Service: Endoscopy;  Laterality: N/A;  . FOOT SURGERY    . NOSE SURGERY    . VASECTOMY      Prior to Admission medications   Medication Sig Start Date End Date Taking? Authorizing Provider  acetaminophen-codeine (TYLENOL #3) 300-30 MG tablet Take 1 tablet by mouth every 8 (eight) hours as needed. 12/17/16   [provider]  ADVAIR  DISKUS 250-50 MCG/DOSE AEPB Take 1 puff by mouth as needed. 12/16/16   [provider]  ALPRAZolam Duanne Moron) 1 MG tablet Take 1 tablet by mouth 3 (three) times daily. 12/06/16   [provider]  ASPIRIN LOW DOSE 81 MG EC tablet Take 1 tablet by mouth daily. 11/26/16   [provider]  gabapentin (NEURONTIN) 600 MG tablet Take 1 tablet (600 mg total) by mouth 4 (four) times daily. 12/22/16   Gillis Santa, MD  hydrochlorothiazide (HYDRODIURIL) 25 MG tablet Take 1 tablet by mouth daily. 11/26/16   [provider]  methocarbamol (ROBAXIN) 500 MG tablet Take 1 tablet (500 mg total) by mouth 4 (four) times daily. 08/09/17   [name], PA-C  naproxen (NAPROSYN) 500 MG tablet Take 1 tablet (500 mg total) by mouth 2 (two) times daily with a meal. 08/09/17   [name], PA-C  nortriptyline (PAMELOR) 25 MG capsule Take 2 capsules by mouth at bedtime as needed. 11/26/16   [provider]  Oxcarbazepine (TRILEPTAL) 300 MG tablet Take 300 mg by mouth 2 (two) times daily.    [provider]  PROAIR HFA 108 [phone] Base) MCG/ACT inhaler Take 2 puffs by mouth 4 (four) times daily as needed. 11/26/16   [provider]  topiramate (TOPAMAX) 50 MG tablet Take 100 mg by mouth 2 (two) times daily. 1 tablet in the am and 2 tablets nightly. 11/26/16   [provider]  VIMPAT  200 MG TABS tablet Take 1 tablet by mouth 2 (two) times daily. 12/01/16   [provider]    Allergies Dilantin [phenytoin] and Sulfa antibiotics  Family History  Problem Relation Age of Onset  . Arthritis Mother   . Cancer Mother   . Mental illness Mother     Social History Social History   Tobacco Use  . Smoking status: Current Every Day Smoker    Packs/day: 0.25    Years: 20.00    Pack years: 5.00    Types: Cigarettes  . Smokeless tobacco: Never Used  Substance Use Topics  . Alcohol use: Not Currently  . Drug use: Never    Review of  Systems Constitutional: No fever/chills Eyes: No visual changes. ENT: No complaints. Cardiovascular: Denies chest pain. Respiratory: Denies shortness of breath. Gastrointestinal:   No nausea, no vomiting.  Genitourinary: Negative for dysuria. Musculoskeletal: Positive for chronic low back pain. Neurological: Negative for headaches, focal weakness or numbness. ____________________________________________   PHYSICAL EXAM:  VITAL SIGNS: ED Triage Vitals [08/09/17 0823]  Enc Vitals Group     BP 120/80     Pulse Rate 72     Resp 16     Temp 97.8 F (36.6 C)     Temp Source Oral     SpO2 97 %     Weight 208 lb (94.3 kg)     Height 5\' 11"  (1.803 m)     Head Circumference      Peak Flow      Pain Score      Pain Loc      Pain Edu?      Excl. in [place]?    Constitutional: Alert and oriented. Well appearing and in no acute distress. Eyes: Conjunctivae are normal. Head: Atraumatic. Neck: No stridor.  No point tenderness on palpation of cervical spine posteriorly.  Soft muscle tissue tenderness is noted bilaterally and into the trapezius muscles bilaterally.  Patient is able to move in all 4 planes slowly.  Range of motion increases his pain. Cardiovascular: Normal rate, regular rhythm. Grossly normal heart sounds.  Good peripheral circulation. Respiratory: Normal respiratory effort.  No retractions. Lungs CTAB. Musculoskeletal: Moves upper and lower extremities without any difficulty.  Good muscle strength bilaterally.  Normal gait. Neurologic:  Normal speech and language. No gross focal neurologic deficits are appreciated. No gait instability. Skin:  Skin is warm, dry and intact.  No rash, ecchymosis or abrasions were seen. Psychiatric: Mood and affect are normal. Speech and behavior are normal.  ____________________________________________   LABS (all labs ordered are listed, but only abnormal results are displayed)  Labs Reviewed - No data to  display  PROCEDURES  Procedure(s) performed: None  Procedures  Critical Care performed: No  ____________________________________________   INITIAL IMPRESSION / ASSESSMENT AND PLAN / ED COURSE  As part of my medical decision making, I reviewed the following data within the electronic MEDICAL RECORD NUMBER Notes from prior ED visits and [place] Controlled Substance Database  Patient was given methocarbamol and Toradol while in the emergency department and was improving prior to discharge.  Patient is encouraged to keep his appointment on August 8 with his PCP.  He also states he has an appointment with his pain management doctor at the end of this month.  He was discharged with a prescription for naproxen 5 mg twice daily with food and methocarbamol 500 mg 4 times daily as needed for muscle spasms.  He is to continue taking gabapentin with this  medication.  Is also encouraged to use ice or heat to his neck as needed for comfort. ____________________________________________   FINAL CLINICAL IMPRESSION(S) / ED DIAGNOSES  Final diagnoses:  Musculoskeletal pain     ED Discharge Orders        Ordered    methocarbamol (ROBAXIN) 500 MG tablet  4 times daily     08/09/17 0937    naproxen (NAPROSYN) 500 MG tablet  2 times daily with meals     08/09/17 5885       Note:  This document was prepared using Dragon voice recognition software and may include unintentional dictation errors.    [name], PA-C 08/09/17 1036    Delman Kitten, MD 08/09/17 [phone]

## 2017-08-09 NOTE — ED Notes (Signed)
Pt c/o bilateral pain to upper back and collar bone area x 2 weeks, states somedays the pain is better than others.  Has hx of back surgery in late 90s.

## 2017-08-09 NOTE — ED Triage Notes (Signed)
Neck pain and upper back pain x 2 weeks.

## 2017-08-20 ENCOUNTER — Ambulatory Visit: Payer: Medicaid Other | Admitting: Student in an Organized Health Care Education/Training Program

## 2017-08-25 ENCOUNTER — Ambulatory Visit
Payer: Medicaid Other | Attending: Student in an Organized Health Care Education/Training Program | Admitting: Student in an Organized Health Care Education/Training Program

## 2017-08-25 ENCOUNTER — Encounter: Payer: Self-pay | Admitting: Student in an Organized Health Care Education/Training Program

## 2017-08-25 ENCOUNTER — Other Ambulatory Visit: Payer: Self-pay

## 2017-08-25 VITALS — BP 137/91 | HR 98 | Temp 98.6°F | Resp 18 | Ht 71.0 in | Wt 206.0 lb

## 2017-08-25 DIAGNOSIS — Z791 Long term (current) use of non-steroidal anti-inflammatories (NSAID): Secondary | ICD-10-CM | POA: Diagnosis not present

## 2017-08-25 DIAGNOSIS — M79601 Pain in right arm: Secondary | ICD-10-CM | POA: Diagnosis not present

## 2017-08-25 DIAGNOSIS — Z7984 Long term (current) use of oral hypoglycemic drugs: Secondary | ICD-10-CM | POA: Diagnosis not present

## 2017-08-25 DIAGNOSIS — J45909 Unspecified asthma, uncomplicated: Secondary | ICD-10-CM | POA: Insufficient documentation

## 2017-08-25 DIAGNOSIS — M542 Cervicalgia: Secondary | ICD-10-CM | POA: Diagnosis not present

## 2017-08-25 DIAGNOSIS — I10 Essential (primary) hypertension: Secondary | ICD-10-CM | POA: Diagnosis not present

## 2017-08-25 DIAGNOSIS — Z79891 Long term (current) use of opiate analgesic: Secondary | ICD-10-CM | POA: Diagnosis not present

## 2017-08-25 DIAGNOSIS — Z79899 Other long term (current) drug therapy: Secondary | ICD-10-CM | POA: Diagnosis not present

## 2017-08-25 DIAGNOSIS — M5441 Lumbago with sciatica, right side: Secondary | ICD-10-CM | POA: Diagnosis not present

## 2017-08-25 DIAGNOSIS — G8929 Other chronic pain: Secondary | ICD-10-CM

## 2017-08-25 DIAGNOSIS — Z882 Allergy status to sulfonamides status: Secondary | ICD-10-CM | POA: Diagnosis not present

## 2017-08-25 DIAGNOSIS — F17210 Nicotine dependence, cigarettes, uncomplicated: Secondary | ICD-10-CM | POA: Diagnosis not present

## 2017-08-25 DIAGNOSIS — Z8261 Family history of arthritis: Secondary | ICD-10-CM | POA: Insufficient documentation

## 2017-08-25 DIAGNOSIS — Z5181 Encounter for therapeutic drug level monitoring: Secondary | ICD-10-CM | POA: Diagnosis not present

## 2017-08-25 DIAGNOSIS — Z7982 Long term (current) use of aspirin: Secondary | ICD-10-CM | POA: Insufficient documentation

## 2017-08-25 DIAGNOSIS — M5442 Lumbago with sciatica, left side: Secondary | ICD-10-CM

## 2017-08-25 DIAGNOSIS — M5136 Other intervertebral disc degeneration, lumbar region: Secondary | ICD-10-CM | POA: Insufficient documentation

## 2017-08-25 DIAGNOSIS — Z809 Family history of malignant neoplasm, unspecified: Secondary | ICD-10-CM | POA: Insufficient documentation

## 2017-08-25 DIAGNOSIS — G894 Chronic pain syndrome: Secondary | ICD-10-CM | POA: Insufficient documentation

## 2017-08-25 MED ORDER — ORPHENADRINE CITRATE 30 MG/ML IJ SOLN
INTRAMUSCULAR | Status: AC
  Filled 2017-08-25: qty 2

## 2017-08-25 MED ORDER — ORPHENADRINE CITRATE 30 MG/ML IJ SOLN
60.0000 mg | Freq: Once | INTRAMUSCULAR | Status: AC
  Administered 2017-08-25: 60 mg via INTRAMUSCULAR

## 2017-08-25 MED ORDER — PREGABALIN 50 MG PO CAPS: 50.0000 mg | ORAL_CAPSULE | Freq: Three times a day (TID) | ORAL | 1 refills | Status: DC

## 2017-08-25 NOTE — Patient Instructions (Addendum)
You have been given RX for Lyrica 50mg  with one refill.  Follow up with x-ray of spine and shoulder.

## 2017-08-25 NOTE — Progress Notes (Signed)
Patient's Name: Mathew Robertson  MRN: [id number]  Referring Provider: [facility]  DOB: [birth year]  PCP: [facility]  DOS: 08/25/2017  Note by: Gillis Santa, MD  Service setting: Ambulatory outpatient  Specialty: Interventional Pain Management  Location: [facility] (AMB) Pain Management Facility    Patient type: Established   Primary Reason(s) for Visit: Encounter for prescription drug management. (Level of risk: moderate)  CC: Right neck pain and right arm pain  HPI  Mathew Robertson is a 52 y.o. year old, male patient, who comes today for a medication management evaluation. He has Chronic bilateral low back pain with bilateral sciatica; Lumbar degenerative disc disease; Chronic pain syndrome; and Cervicalgia on their problem list. His primarily concern today is the Right neck pain and right arm pain  Pain Assessment: Location: Right Shoulder Radiating: radiates from right neck to right shoulder, down right arm and fingers  Onset: More than a month ago Duration: Chronic pain Quality: Constant, Sharp, Shooting Severity: 10-Worst pain ever/10 (subjective, self-reported pain score)  Note: Reported level is inconsistent with clinical observations. Clinically the patient looks like a 3/10 A 3/10 is viewed as "Moderate" and described as significantly interfering with activities of daily living (ADL). It becomes difficult to feed, bathe, get dressed, get on and off the toilet or to perform personal hygiene functions. Difficult to get in and out of bed or a chair without assistance. Very distracting. With effort, it can be ignored when deeply involved in activities.       When using our objective Pain Scale, levels between 6 and 10/10 are said to belong in an emergency room, as it progressively worsens from a 6/10, described as severely limiting, requiring emergency care not usually available at an outpatient pain management facility. At a 6/10 level, communication becomes difficult and  requires great effort. Assistance to reach the emergency department may be required. Facial flushing and profuse sweating along with potentially dangerous increases in heart rate and blood pressure will be evident. Effect on ADL: "Unable to pick up anything heavy and can not lift with right arm"  Timing: Constant Modifying factors: Denies  BP: (!) 137/91  HR: 98  Mathew Robertson was last scheduled for an appointment on Visit date not found for medication management. During today's appointment we reviewed Mathew Robertson's chronic pain status, as well as his outpatient medication regimen.  Patient's previous visit with me was in December.  He has not followed up since then.  Patient is endorsing right-sided neck pain that radiates down his right arm, forearm, occasionally down to his fingers.  He is endorsing weakness in his hands.  Patient was unable to get records from his previous pain provider.  He is continuing Xanax daily for anxiety.  Patient did go to the emergency department for right neck pain.  He was given muscle relaxant as well as NSAID and discharged home.  Patient would not be a candidate for chronic opioid therapy given his chronic Xanax use.  We will focus on non-opioid pain management in the form of interventional therapies, physical therapy, non-narcotic-based analgesics.  The patient  reports that he does not use drugs. His body mass index is 28.73 kg/m.  Further details on both, my assessment(s), as well as the proposed treatment plan, please see below.    Laboratory Chemistry  Inflammation Markers (CRP: Acute Phase) (ESR: Chronic Phase) Lab Results  Component Value Date   CRP 3.7 12/22/2016   ESRSEDRATE 12 12/22/2016  Rheumatology Markers No results found for: RF, ANA, LABURIC, URICUR, LYMEIGGIGMAB, LYMEABIGMQN, HLAB27                      Renal Function Markers Lab Results  Component Value Date   BUN 22 12/22/2016   CREATININE 1.12 12/22/2016   BCR  20 12/22/2016   GFRAA 87 12/22/2016   GFRNONAA 76 12/22/2016                             Hepatic Function Markers Lab Results  Component Value Date   AST 16 12/22/2016   ALT 12 12/22/2016   ALBUMIN 4.8 12/22/2016   ALKPHOS 115 12/22/2016                        Electrolytes Lab Results  Component Value Date   NA 146 (H) 12/22/2016   K 3.8 12/22/2016   CL 104 12/22/2016   CALCIUM 9.1 12/22/2016   MG 2.2 12/22/2016                        Neuropathy Markers Lab Results  Component Value Date   VITAMINB12 280 12/22/2016                        Bone Pathology Markers Lab Results  Component Value Date   25OHVITD1 25 (L) 12/22/2016   25OHVITD2 <1.0 12/22/2016   25OHVITD3 25 12/22/2016                         Coagulation Parameters No results found for: INR, LABPROT, APTT, PLT, DDIMER                      Cardiovascular Markers No results found for: BNP, CKTOTAL, CKMB, TROPONINI, HGB, HCT                       CA Markers No results found for: CEA, CA125, LABCA2                      Note: Lab results reviewed.  Recent Diagnostic Imaging Results  No image results found.  Complexity Note: Imaging results reviewed. Results shared with Mathew Robertson, using Layman's terms.                         Meds   Current Outpatient Medications:  .  ADVAIR DISKUS 250-50 MCG/DOSE AEPB, Take 1 puff by mouth as needed., Disp: , Rfl: 0 .  ALPRAZolam (XANAX) 1 MG tablet, Take 1 tablet by mouth 3 (three) times daily., Disp: , Rfl: 0 .  ASPIRIN LOW DOSE 81 MG EC tablet, Take 1 tablet by mouth daily., Disp: , Rfl: 3 .  hydrochlorothiazide (HYDRODIURIL) 25 MG tablet, Take 1 tablet by mouth daily., Disp: , Rfl: 2 .  methocarbamol (ROBAXIN) 500 MG tablet, Take 1 tablet (500 mg total) by mouth 4 (four) times daily., Disp: 12 tablet, Rfl: 0 .  naproxen (NAPROSYN) 500 MG tablet, Take 1 tablet (500 mg total) by mouth 2 (two) times daily with a meal., Disp: 30 tablet, Rfl: 0 .  nortriptyline  (PAMELOR) 25 MG capsule, Take 2 capsules by mouth at bedtime as needed., Disp: , Rfl: 0 .  omeprazole (PRILOSEC) 40 MG capsule, TK ONE  C PO QD, Disp: , Rfl: 0 .  Oxcarbazepine (TRILEPTAL) 300 MG tablet, Take 300 mg by mouth 2 (two) times daily., Disp: , Rfl:  .  PROAIR HFA 108 (90 Base) MCG/ACT inhaler, Take 2 puffs by mouth 4 (four) times daily as needed., Disp: , Rfl: 1 .  topiramate (TOPAMAX) 50 MG tablet, Take 100 mg by mouth 2 (two) times daily. 1 tablet in the am and 2 tablets nightly., Disp: , Rfl: 0 .  VIMPAT 200 MG TABS tablet, Take 1 tablet by mouth 2 (two) times daily., Disp: , Rfl: 0 .  acetaminophen-codeine (TYLENOL #3) 300-30 MG tablet, Take 1 tablet by mouth every 8 (eight) hours as needed., Disp: , Rfl: 0 .  pregabalin (LYRICA) 50 MG capsule, Take 1 capsule (50 mg total) by mouth 3 (three) times daily., Disp: 90 capsule, Rfl: 1  ROS  Constitutional: Denies any fever or chills Gastrointestinal: No reported hemesis, hematochezia, vomiting, or acute GI distress Musculoskeletal: Denies any acute onset joint swelling, redness, loss of ROM, or weakness Neurological: No reported episodes of acute onset apraxia, aphasia, dysarthria, agnosia, amnesia, paralysis, loss of coordination, or loss of consciousness  Allergies  Mathew Robertson is allergic to dilantin [phenytoin] and sulfa antibiotics.  Mathew Robertson  Drug: Mathew Robertson  reports that he does not use drugs. Alcohol:  reports that he drank alcohol. Tobacco:  reports that he has been smoking cigarettes. He has a 5.00 pack-year smoking history. He has never used smokeless tobacco. Medical:  has a past medical history of Asthma, Chronic pain disorder, Headache, Hypertension, and Seizures ([place]). Surgical: Mathew Robertson  has a past surgical history that includes Back surgery; cosmetic eye surgery; Foot surgery; Nose surgery; Vasectomy; Esophagogastroduodenoscopy (egd) with propofol (N/A, 06/05/2017); and Colonoscopy with propofol (N/A, 06/05/2017). Family:  family history includes Arthritis in his mother; Cancer in his mother; Mental illness in his mother.  Constitutional Exam  General appearance: Well nourished, well developed, and well hydrated. In no apparent acute distress Vitals:   08/25/17 1407  BP: (!) 137/91  Pulse: 98  Resp: 18  Temp: 98.6 F (37 C)  SpO2: 98%  Weight: 206 lb (93.4 kg)  Height: '5\' 11"'$  (1.803 m)   BMI Assessment: Estimated body mass index is 28.73 kg/m as calculated from the following:   Height as of this encounter: '5\' 11"'$  (1.803 m).   Weight as of this encounter: 206 lb (93.4 kg).  BMI interpretation table: BMI level Category Range association with higher incidence of chronic pain  <18 kg/m2 Underweight   18.5-24.9 kg/m2 Ideal body weight   25-29.9 kg/m2 Overweight Increased incidence by 20%  30-34.9 kg/m2 Obese (Class I) Increased incidence by 68%  35-39.9 kg/m2 Severe obesity (Class II) Increased incidence by 136%  >40 kg/m2 Extreme obesity (Class III) Increased incidence by 254%   Patient's current BMI Ideal Body weight  Body mass index is 28.73 kg/m. Ideal body weight: 75.3 kg (166 lb 0.1 oz) Adjusted ideal body weight: 82.6 kg (182 lb 0.1 oz)   BMI Readings from Last 4 Encounters:  08/25/17 28.73 kg/m  08/09/17 29.01 kg/m  06/30/17 29.43 kg/m  06/05/17 29.29 kg/m   Wt Readings from Last 4 Encounters:  08/25/17 206 lb (93.4 kg)  08/09/17 208 lb (94.3 kg)  06/30/17 211 lb (95.7 kg)  06/05/17 210 lb (95.3 kg)  Psych/Mental status: Alert, oriented x 3 (person, place, & time)       Eyes: PERLA Respiratory: No evidence of acute respiratory distress  Cervical  Spine Area Exam  Skin & Axial Inspection: No masses, redness, edema, swelling, or associated skin lesions Alignment: Symmetrical Functional ROM: Decreased ROM, to the right Stability: No instability detected Muscle Tone/Strength: Functionally intact. No obvious neuro-muscular anomalies detected. Sensory (Neurological): Dermatomal  pain pattern and musculoskeletal Palpation: Complains of area being tender to palpation Positive provocative maneuver for for right occipital neuralgia and right cervical facet disease  Upper Extremity (UE) Exam    Side: Right upper extremity  Side: Left upper extremity  Skin & Extremity Inspection: Skin color, temperature, and hair growth are WNL. No peripheral edema or cyanosis. No masses, redness, swelling, asymmetry, or associated skin lesions. No contractures.  Skin & Extremity Inspection: Skin color, temperature, and hair growth are WNL. No peripheral edema or cyanosis. No masses, redness, swelling, asymmetry, or associated skin lesions. No contractures.  Functional ROM: Unrestricted ROM          Functional ROM: Unrestricted ROM          Muscle Tone/Strength: Functionally intact. No obvious neuro-muscular anomalies detected.  Muscle Tone/Strength: Functionally intact. No obvious neuro-muscular anomalies detected.  Sensory (Neurological): Neuropathic pain pattern          Sensory (Neurological): Unimpaired          Palpation: No palpable anomalies              Palpation: No palpable anomalies              Provocative Test(s):  Phalen's test: deferred Tinel's test: deferred Apley's scratch test (touch opposite shoulder):  Action 1 (Across chest): Decreased ROM Action 2 (Overhead): Decreased ROM Action 3 (LB reach): Decreased ROM   Provocative Test(s):  Phalen's test: deferred Tinel's test: deferred Apley's scratch test (touch opposite shoulder):  Action 1 (Across chest): deferred Action 2 (Overhead): deferred Action 3 (LB reach): deferred    Thoracic Spine Area Exam  Skin & Axial Inspection: No masses, redness, or swelling Alignment: Symmetrical Functional ROM: Unrestricted ROM Stability: No instability detected Muscle Tone/Strength: Functionally intact. No obvious neuro-muscular anomalies detected. Sensory (Neurological): Unimpaired Muscle strength & Tone: No palpable  anomalies  Lumbar Spine Area Exam  Skin & Axial Inspection: No masses, redness, or swelling Alignment: Symmetrical Functional ROM: Unrestricted ROM       Stability: No instability detected Muscle Tone/Strength: Functionally intact. No obvious neuro-muscular anomalies detected. Sensory (Neurological): Unimpaired Palpation: No palpable anomalies       Provocative Tests: Hyperextension/rotation test: deferred today       Lumbar quadrant test (Kemp's test): deferred today       Lateral bending test: deferred today       Patrick's Maneuver: deferred today                   FABER test: deferred today                   S-I anterior distraction/compression test: deferred today         S-I lateral compression test: deferred today         S-I Thigh-thrust test: deferred today         S-I Gaenslen's test: deferred today          Gait & Posture Assessment  Ambulation: Unassisted Gait: Relatively normal for age and body habitus Posture: WNL   Lower Extremity Exam    Side: Right lower extremity  Side: Left lower extremity  Stability: No instability observed          Stability: No  instability observed          Skin & Extremity Inspection: Skin color, temperature, and hair growth are WNL. No peripheral edema or cyanosis. No masses, redness, swelling, asymmetry, or associated skin lesions. No contractures.  Skin & Extremity Inspection: Skin color, temperature, and hair growth are WNL. No peripheral edema or cyanosis. No masses, redness, swelling, asymmetry, or associated skin lesions. No contractures.  Functional ROM: Unrestricted ROM                  Functional ROM: Unrestricted ROM                  Muscle Tone/Strength: Functionally intact. No obvious neuro-muscular anomalies detected.  Muscle Tone/Strength: Functionally intact. No obvious neuro-muscular anomalies detected.  Sensory (Neurological): Unimpaired  Sensory (Neurological): Unimpaired  Palpation: No palpable anomalies  Palpation: No  palpable anomalies   Assessment  Primary Diagnosis & Pertinent Problem List: The primary encounter diagnosis was Neck pain. Diagnoses of Chronic bilateral low back pain with bilateral sciatica, Lumbar degenerative disc disease, Cervicalgia, and Chronic pain syndrome were also pertinent to this visit.  Status Diagnosis  Having a Flare-up Persistent Persistent 1. Neck pain   2. Chronic bilateral low back pain with bilateral sciatica   3. Lumbar degenerative disc disease   4. Cervicalgia   5. Chronic pain syndrome      General Recommendations: The pain condition that the patient suffers from is best treated with a multidisciplinary approach that involves an increase in physical activity to prevent de-conditioning and worsening of the pain cycle, as well as psychological counseling (formal and/or informal) to address the co-morbid psychological affects of pain. Treatment will often involve judicious use of pain medications and interventional procedures to decrease the pain, allowing the patient to participate in the physical activity that will ultimately produce long-lasting pain reductions. The goal of the multidisciplinary approach is to return the patient to a higher level of overall function and to restore their ability to perform activities of daily living.  Patient's previous visit with me was in December.  He has not followed up since then.  Patient is endorsing right-sided neck pain that radiates down his right arm, forearm, occasionally down to his fingers.  He is endorsing weakness in his hands.  Patient was unable to get records from his previous pain provider.  He is continuing Xanax daily for anxiety.  Patient did go to the emergency department for right neck pain.  He was given muscle relaxant as well as NSAID and discharged home.  Patient would not be a candidate for chronic opioid therapy given his chronic Xanax use.  We will focus on non-opioid pain management in the form of  interventional therapies, physical therapy, non-narcotic-based analgesics.  To evaluate his right neck pain that radiates down his right arm, will obtain x-rays of the cervical spine as well as right shoulder.  This may need to be followed up with CT of cervical spine versus cervical MRI as the patient's symptoms do sound radicular in nature.  Patient does not find any benefit with gabapentin.  His current dose is 600 mill grams 4 times a day.  I tried to the patient to discontinue gabapentin and start Lyrica 50 mg 3 times daily.  Patient's kidney function was within normal limits on his most recent metabolic panel.  Pending radiographic imaging, patient may be a candidate for cervical epidural steroid injection, cervical facet medial branch nerve blocks, cervical trigger point injections.  Patient will also  receive 60 mg of intramuscular Norflex today for his musculoskeletal pain in his right neck.  Plan: -Cervical spine x-ray, right shoulder x-ray.  Me to follow with CT of cervical spine versus MRI of cervical spine -60 mg of IM Norflex -Stop gabapentin start Lyrica 50 mill grams 3 times daily -Could be candidate for cervical ESI, cervical facets, cervical trigger point injections pending radial graphic imaging -Patient will not be a candidate for chronic opioid therapy at this clinic given his chronic use of Xanax.  Plan of Care  Pharmacotherapy (Medications Ordered): Meds ordered this encounter  Medications  . pregabalin (LYRICA) 50 MG capsule    Sig: Take 1 capsule (50 mg total) by mouth 3 (three) times daily.    Dispense:  90 capsule    Refill:  1    Do not place this medication, or any other prescription from our practice, on "Automatic Refill". Patient may have prescription filled one day early if pharmacy is closed on scheduled refill date.  . orphenadrine (NORFLEX) injection 60 mg   Lab-work, procedure(s), and/or referral(s): Orders Placed This Encounter  Procedures  . DG  Cervical Spine With Flex & Extend  . DG Shoulder Right   Time Note: Greater than 50% of the 25 minute(s) of face-to-face time spent with Mr. Funari, was spent in counseling/coordination of care regarding: the appropriate use of the pain scale, Mr. Arroyave's primary cause of pain, the treatment plan, treatment alternatives, medication side effects, the appropriate use of his medications and the goals of pain management (increased in functionality).  Provider-requested follow-up: Return in about 3 weeks (around 09/15/2017) for Medication Management, After Imaging.  Future Appointments  Date Time Provider [place]  09/17/2017  1:30 PM Gillis Santa, MD [facility] None    Primary Care Physician: Mathew Robertson Location: [facility] Note by: Gillis Santa, M.D Date: 08/25/2017; Time: 4:15 PM  Patient Instructions  You have been given RX for Lyrica '50mg'$  with one refill.  Follow up with x-ray of spine and shoulder.

## 2017-08-25 NOTE — Progress Notes (Signed)
Safety precautions to be maintained throughout the outpatient stay will include: orient to surroundings, keep bed in low position, maintain call bell within reach at all times, provide assistance with transfer out of bed and ambulation.  

## 2017-08-26 ENCOUNTER — Telehealth: Payer: Self-pay | Admitting: *Deleted

## 2017-08-26 NOTE — Telephone Encounter (Signed)
Patient instructed to begin Gabapentin again. He says he has some at home.

## 2017-08-26 NOTE — Telephone Encounter (Signed)
Dr. Holley Raring, Do you want to try something else, or just not take anything right now.

## 2017-08-27 ENCOUNTER — Ambulatory Visit
Admission: RE | Admit: 2017-08-27 | Discharge: 2017-08-27 | Disposition: A | Discharge status: Home / Self Care | Payer: Medicaid Other | Source: Ambulatory Visit | Attending: Student in an Organized Health Care Education/Training Program | Admitting: Student in an Organized Health Care Education/Training Program

## 2017-08-27 DIAGNOSIS — M542 Cervicalgia: Secondary | ICD-10-CM | POA: Diagnosis present

## 2017-08-27 DIAGNOSIS — M50323 Other cervical disc degeneration at C6-C7 level: Secondary | ICD-10-CM | POA: Insufficient documentation

## 2017-09-17 ENCOUNTER — Other Ambulatory Visit: Payer: Self-pay

## 2017-09-17 ENCOUNTER — Ambulatory Visit
Payer: Medicaid Other | Attending: Student in an Organized Health Care Education/Training Program | Admitting: Student in an Organized Health Care Education/Training Program

## 2017-09-17 ENCOUNTER — Encounter: Payer: Self-pay | Admitting: Student in an Organized Health Care Education/Training Program

## 2017-09-17 VITALS — BP 124/88 | HR 60 | Temp 98.5°F | Resp 18 | Ht 71.0 in | Wt 212.0 lb

## 2017-09-17 DIAGNOSIS — I10 Essential (primary) hypertension: Secondary | ICD-10-CM | POA: Insufficient documentation

## 2017-09-17 DIAGNOSIS — M5412 Radiculopathy, cervical region: Secondary | ICD-10-CM | POA: Diagnosis not present

## 2017-09-17 DIAGNOSIS — M5116 Intervertebral disc disorders with radiculopathy, lumbar region: Secondary | ICD-10-CM | POA: Insufficient documentation

## 2017-09-17 DIAGNOSIS — Z881 Allergy status to other antibiotic agents status: Secondary | ICD-10-CM | POA: Insufficient documentation

## 2017-09-17 DIAGNOSIS — Z5181 Encounter for therapeutic drug level monitoring: Secondary | ICD-10-CM | POA: Diagnosis not present

## 2017-09-17 DIAGNOSIS — F17210 Nicotine dependence, cigarettes, uncomplicated: Secondary | ICD-10-CM | POA: Diagnosis not present

## 2017-09-17 DIAGNOSIS — M4802 Spinal stenosis, cervical region: Secondary | ICD-10-CM | POA: Insufficient documentation

## 2017-09-17 DIAGNOSIS — M4722 Other spondylosis with radiculopathy, cervical region: Secondary | ICD-10-CM | POA: Diagnosis not present

## 2017-09-17 DIAGNOSIS — G894 Chronic pain syndrome: Secondary | ICD-10-CM | POA: Diagnosis present

## 2017-09-17 DIAGNOSIS — Z7982 Long term (current) use of aspirin: Secondary | ICD-10-CM | POA: Insufficient documentation

## 2017-09-17 DIAGNOSIS — M542 Cervicalgia: Secondary | ICD-10-CM | POA: Diagnosis not present

## 2017-09-17 DIAGNOSIS — Z79899 Other long term (current) drug therapy: Secondary | ICD-10-CM | POA: Insufficient documentation

## 2017-09-17 DIAGNOSIS — M9981 Other biomechanical lesions of cervical region: Secondary | ICD-10-CM | POA: Diagnosis not present

## 2017-09-17 DIAGNOSIS — R569 Unspecified convulsions: Secondary | ICD-10-CM | POA: Insufficient documentation

## 2017-09-17 DIAGNOSIS — M25511 Pain in right shoulder: Secondary | ICD-10-CM | POA: Diagnosis not present

## 2017-09-17 NOTE — Patient Instructions (Signed)
Moderate Conscious Sedation, Adult Sedation is the use of medicines to promote relaxation and relieve discomfort and anxiety. Moderate conscious sedation is a type of sedation. Under moderate conscious sedation, you are less alert than normal, but you are still able to respond to instructions, touch, or both. Moderate conscious sedation is used during short medical and dental procedures. It is milder than deep sedation, which is a type of sedation under which you cannot be easily woken up. It is also milder than general anesthesia, which is the use of medicines to make you unconscious. Moderate conscious sedation allows you to return to your regular activities sooner. Tell a health care provider about:  Any allergies you have.  All medicines you are taking, including vitamins, herbs, eye drops, creams, and over-the-counter medicines.  Use of steroids (by mouth or creams).  Any problems you or family members have had with sedatives and anesthetic medicines.  Any blood disorders you have.  Any surgeries you have had.  Any medical conditions you have, such as sleep apnea.  Whether you are pregnant or may be pregnant.  Any use of cigarettes, alcohol, marijuana, or street drugs. What are the risks? Generally, this is a safe procedure. However, problems may occur, including:  Getting too much medicine (oversedation).  Nausea.  Allergic reaction to medicines.  Trouble breathing. If this happens, a breathing tube may be used to help with breathing. It will be removed when you are awake and breathing on your own.  Heart trouble.  Lung trouble.  What happens before the procedure? Staying hydrated Follow instructions from your health care provider about hydration, which may include:  Up to 2 hours before the procedure - you may continue to drink clear liquids, such as water, clear fruit juice, black coffee, and plain tea.  Eating and drinking restrictions Follow instructions from  your health care provider about eating and drinking, which may include:  8 hours before the procedure - stop eating heavy meals or foods such as meat, fried foods, or fatty foods.  6 hours before the procedure - stop eating light meals or foods, such as toast or cereal.  6 hours before the procedure - stop drinking milk or drinks that contain milk.  2 hours before the procedure - stop drinking clear liquids.  Medicine  Ask your health care provider about:  Changing or stopping your regular medicines. This is especially important if you are taking diabetes medicines or blood thinners.  Taking medicines such as aspirin and ibuprofen. These medicines can thin your blood. Do not take these medicines before your procedure if your health care provider instructs you not to.  Tests and exams  You will have a physical exam.  You may have blood tests done to show: ? How well your kidneys and liver are working. ? How well your blood can clot. General instructions  Plan to have someone take you home from the hospital or clinic.  If you will be going home right after the procedure, plan to have someone with you for 24 hours. What happens during the procedure?  An IV tube will be inserted into one of your veins.  Medicine to help you relax (sedative) will be given through the IV tube.  The medical or dental procedure will be performed. What happens after the procedure?  Your blood pressure, heart rate, breathing rate, and blood oxygen level will be monitored often until the medicines you were given have worn off.  Do not drive for 24 hours.   This information is not intended to replace advice given to you by your health care provider. Make sure you discuss any questions you have with your health care provider. Document Released: 09/17/2000 Document Revised: 05/29/2015 Document Reviewed: 04/14/2015 [employer] Interactive Patient Education  2018 [employer] GENERAL RISKS AND  COMPLICATIONS  What are the risk, side effects and possible complications? Generally speaking, most procedures are safe.  However, with any procedure there are risks, side effects, and the possibility of complications.  The risks and complications are dependent upon the sites that are lesioned, or the type of nerve block to be performed.  The closer the procedure is to the spine, the more serious the risks are.  Great care is taken when placing the radio frequency needles, block needles or lesioning probes, but sometimes complications can occur. 1. Infection: Any time there is an injection through the skin, there is a risk of infection.  This is why sterile conditions are used for these blocks.  There are four possible types of infection. 1. Localized skin infection. 2. Central Nervous System Infection-This can be in the form of Meningitis, which can be deadly. 3. Epidural Infections-This can be in the form of an epidural abscess, which can cause pressure inside of the spine, causing compression of the spinal cord with subsequent paralysis. This would require an emergency surgery to decompress, and there are no guarantees that the patient would recover from the paralysis. 4. Discitis-This is an infection of the intervertebral discs.  It occurs in about 1% of discography procedures.  It is difficult to treat and it may lead to surgery.        2. Pain: the needles have to go through skin and soft tissues, will cause soreness.       3. Damage to internal structures:  The nerves to be lesioned may be near blood vessels or    other nerves which can be potentially damaged.       4. Bleeding: Bleeding is more common if the patient is taking blood thinners such as  aspirin, Coumadin, Ticiid, Plavix, etc., or if he/she have some genetic predisposition  such as hemophilia. Bleeding into the spinal canal can cause compression of the spinal  cord with subsequent paralysis.  This would require an emergency surgery  to  decompress and there are no guarantees that the patient would recover from the  paralysis.       5. Pneumothorax:  Puncturing of a lung is a possibility, every time a needle is introduced in  the area of the chest or upper back.  Pneumothorax refers to free air around the  collapsed lung(s), inside of the thoracic cavity (chest cavity).  Another two possible  complications related to a similar event would include: Hemothorax and Chylothorax.   These are variations of the Pneumothorax, where instead of air around the collapsed  lung(s), you may have blood or chyle, respectively.       6. Spinal headaches: They may occur with any procedures in the area of the spine.       7. Persistent CSF (Cerebro-Spinal Fluid) leakage: This is a rare problem, but may occur  with prolonged intrathecal or epidural catheters either due to the formation of a fistulous  track or a dural tear.       8. Nerve damage: By working so close to the spinal cord, there is always a possibility of  nerve damage, which could be as serious as a permanent spinal cord injury with    paralysis.       9. Death:  Although rare, severe deadly allergic reactions known as "Anaphylactic  reaction" can occur to any of the medications used.      10. Worsening of the symptoms:  We can always make thing worse.  What are the chances of something like this happening? Chances of any of this occuring are extremely low.  By statistics, you have more of a chance of getting killed in a motor vehicle accident: while driving to the hospital than any of the above occurring .  Nevertheless, you should be aware that they are possibilities.  In general, it is similar to taking a shower.  Everybody knows that you can slip, hit your head and get killed.  Does that mean that you should not shower again?  Nevertheless always keep in mind that statistics do not mean anything if you happen to be on the wrong side of them.  Even if a procedure has a 1 (one) in a 1,000,000  (million) chance of going wrong, it you happen to be that one..Also, keep in mind that by statistics, you have more of a chance of having something go wrong when taking medications.  Who should not have this procedure? If you are on a blood thinning medication (e.g. Coumadin, Plavix, see list of "Blood Thinners"), or if you have an active infection going on, you should not have the procedure.  If you are taking any blood thinners, please inform your physician.  How should I prepare for this procedure?  Do not eat or drink anything at least six hours prior to the procedure.  Bring a driver with you .  It cannot be a taxi.  Come accompanied by an adult that can drive you back, and that is strong enough to help you if your legs get weak or numb from the local anesthetic.  Take all of your medicines the morning of the procedure with just enough water to swallow them.  If you have diabetes, make sure that you are scheduled to have your procedure done first thing in the morning, whenever possible.  If you have diabetes, take only half of your insulin dose and notify our nurse that you have done so as soon as you arrive at the clinic.  If you are diabetic, but only take blood sugar pills (oral hypoglycemic), then do not take them on the morning of your procedure.  You may take them after you have had the procedure.  Do not take aspirin or any aspirin-containing medications, at least eleven (11) days prior to the procedure.  They may prolong bleeding.  Wear loose fitting clothing that may be easy to take off and that you would not mind if it got stained with Betadine or blood.  Do not wear any jewelry or perfume  Remove any nail coloring.  It will interfere with some of our monitoring equipment.  NOTE: Remember that this is not meant to be interpreted as a complete list of all possible complications.  Unforeseen problems may occur.  BLOOD THINNERS The following drugs contain aspirin or other  products, which can cause increased bleeding during surgery and should not be taken for 2 weeks prior to and 1 week after surgery.  If you should need take something for relief of minor pain, you may take acetaminophen which is found in Tylenol,m Datril, Anacin-3 and Panadol. It is not blood thinner. The products listed below are.  Do not take any of the products listed   below in addition to any listed on your instruction sheet.  A.P.C or A.P.C with Codeine Codeine Phosphate Capsules #3 Ibuprofen Ridaura  ABC compound Congesprin Imuran rimadil  Advil Cope Indocin Robaxisal  Alka-Seltzer Effervescent Pain Reliever and Antacid Coricidin or Coricidin-D  Indomethacin Rufen  Alka-Seltzer plus Cold Medicine Cosprin Ketoprofen S-A-C Tablets  Anacin Analgesic Tablets or Capsules Coumadin Korlgesic Salflex  Anacin Extra Strength Analgesic tablets or capsules CP-2 Tablets Lanoril Salicylate  Anaprox Cuprimine Capsules Levenox Salocol  Anexsia-D Dalteparin Magan Salsalate  Anodynos Darvon compound Magnesium Salicylate Sine-off  Ansaid Dasin Capsules Magsal Sodium Salicylate  Anturane Depen Capsules Marnal Soma  APF Arthritis pain formula Dewitt's Pills Measurin Stanback  Argesic Dia-Gesic Meclofenamic Sulfinpyrazone  Arthritis Bayer Timed Release Aspirin Diclofenac Meclomen Sulindac  Arthritis pain formula Anacin Dicumarol Medipren Supac  Analgesic (Safety coated) Arthralgen Diffunasal Mefanamic Suprofen  Arthritis Strength Bufferin Dihydrocodeine Mepro Compound Suprol  Arthropan liquid Dopirydamole Methcarbomol with Aspirin Synalgos  ASA tablets/Enseals Disalcid Micrainin Tagament  Ascriptin Doan's Midol Talwin  Ascriptin A/D Dolene Mobidin Tanderil  Ascriptin Extra Strength Dolobid Moblgesic Ticlid  Ascriptin with Codeine Doloprin or Doloprin with Codeine Momentum Tolectin  Asperbuf Duoprin Mono-gesic Trendar  Aspergum Duradyne Motrin or Motrin IB Triminicin  Aspirin plain, buffered or enteric  coated Durasal Myochrisine Trigesic  Aspirin Suppositories Easprin Nalfon Trillsate  Aspirin with Codeine Ecotrin Regular or Extra Strength Naprosyn Uracel  Atromid-S Efficin Naproxen Ursinus  Auranofin Capsules Elmiron Neocylate Vanquish  Axotal Emagrin Norgesic Verin  Azathioprine Empirin or Empirin with Codeine Normiflo Vitamin E  Azolid Emprazil Nuprin Voltaren  Bayer Aspirin plain, buffered or children's or timed BC Tablets or powders Encaprin Orgaran Warfarin Sodium  Buff-a-Comp Enoxaparin Orudis Zorpin  Buff-a-Comp with Codeine Equegesic Os-Cal-Gesic   Buffaprin Excedrin plain, buffered or Extra Strength Oxalid   Bufferin Arthritis Strength Feldene Oxphenbutazone   Bufferin plain or Extra Strength Feldene Capsules Oxycodone with Aspirin   Bufferin with Codeine Fenoprofen Fenoprofen Pabalate or Pabalate-SF   Buffets II Flogesic Panagesic   Buffinol plain or Extra Strength Florinal or Florinal with Codeine Panwarfarin   Buf-Tabs Flurbiprofen Penicillamine   Butalbital Compound Four-way cold tablets Penicillin   Butazolidin Fragmin Pepto-Bismol   Carbenicillin Geminisyn Percodan   Carna Arthritis Reliever Geopen Persantine   Carprofen Gold's salt Persistin   Chloramphenicol Goody's Phenylbutazone   Chloromycetin Haltrain Piroxlcam   Clmetidine heparin Plaquenil   Cllnoril Hyco-pap Ponstel   Clofibrate Hydroxy chloroquine Propoxyphen         Before stopping any of these medications, be sure to consult the physician who ordered them.  Some, such as Coumadin (Warfarin) are ordered to prevent or treat serious conditions such as "deep thrombosis", "pumonary embolisms", and other heart problems.  The amount of time that you may need off of the medication may also vary with the medication and the reason for which you were taking it.  If you are taking any of these medications, please make sure you notify your pain physician before you undergo any procedures.         Epidural  Steroid Injection Patient Information  Description: The epidural space surrounds the nerves as they exit the spinal cord.  In some patients, the nerves can be compressed and inflamed by a bulging disc or a tight spinal canal (spinal stenosis).  By injecting steroids into the epidural space, we can bring irritated nerves into direct contact with a potentially helpful medication.  These steroids act directly on the irritated nerves and can reduce swelling and inflammation  which often leads to decreased pain.  Epidural steroids may be injected anywhere along the spine and from the neck to the low back depending upon the location of your pain.   After numbing the skin with local anesthetic (like Novocaine), a small needle is passed into the epidural space slowly.  You may experience a sensation of pressure while this is being done.  The entire block usually last less than 10 minutes.  Conditions which may be treated by epidural steroids:   Low back and leg pain  Neck and arm pain  Spinal stenosis  Post-laminectomy syndrome  Herpes zoster (shingles) pain  Pain from compression fractures  Preparation for the injection:  1. Do not eat any solid food or dairy products within 8 hours of your appointment.  2. You may drink clear liquids up to 3 hours before appointment.  Clear liquids include water, black coffee, juice or soda.  No milk or cream please. 3. You may take your regular medication, including pain medications, with a sip of water before your appointment  Diabetics should hold regular insulin (if taken separately) and take 1/2 normal NPH dos the morning of the procedure.  Carry some sugar containing items with you to your appointment. 4. A driver must accompany you and be prepared to drive you home after your procedure.  5. Bring all your current medications with your. 6. An IV may be inserted and sedation may be given at the discretion of the physician.   7. A blood pressure cuff, EKG  and other monitors will often be applied during the procedure.  Some patients may need to have extra oxygen administered for a short period. 8. You will be asked to provide medical information, including your allergies, prior to the procedure.  We must know immediately if you are taking blood thinners (like Coumadin/Warfarin)  Or if you are allergic to IV iodine contrast (dye). We must know if you could possible be pregnant.  Possible side-effects:  Bleeding from needle site  Infection (rare, may require surgery)  Nerve injury (rare)  Numbness & tingling (temporary)  Difficulty urinating (rare, temporary)  Spinal headache ( a headache worse with upright posture)  Light -headedness (temporary)  Pain at injection site (several days)  Decreased blood pressure (temporary)  Weakness in arm/leg (temporary)  Pressure sensation in back/neck (temporary)  Call if you experience:  Fever/chills associated with headache or increased back/neck pain.  Headache worsened by an upright position.  New onset weakness or numbness of an extremity below the injection site  Hives or difficulty breathing (go to the emergency room)  Inflammation or drainage at the infection site  Severe back/neck pain  Any new symptoms which are concerning to you  Please note:  Although the local anesthetic injected can often make your back or neck feel good for several hours after the injection, the pain will likely return.  It takes 3-7 days for steroids to work in the epidural space.  You may not notice any pain relief for at least that one week.  If effective, we will often do a series of three injections spaced 3-6 weeks apart to maximally decrease your pain.  After the initial series, we generally will wait several months before considering a repeat injection of the same type.  If you have any questions, please call [phone] [facility]

## 2017-09-17 NOTE — Progress Notes (Signed)
Patient's Name: Mathew Robertson  MRN: [id number]  Referring Provider: [facility]  DOB: 1965-08-06  PCP: [facility] [place]  DOS: 09/17/2017  Note by: Gillis Santa, MD  Service setting: Ambulatory outpatient  Specialty: Interventional Pain Management  Location: [facility] (AMB) Pain Management Facility    Patient type: Established   Primary Reason(s) for Visit: Encounter for prescription drug management. (Level of risk: moderate)  CC: Shoulder Pain (right)  HPI  Mathew Robertson is a 52 y.o. year old, male patient, who comes today for a medication management evaluation. He has Chronic bilateral low back pain with bilateral sciatica; Lumbar degenerative disc disease; Chronic pain syndrome; Cervicalgia; Right cervical radiculopathy; Foraminal stenosis of cervical region (R); and Osteoarthritis of spine with radiculopathy, cervical region on their problem list. His primarily concern today is the Shoulder Pain (right)  Pain Assessment: Location: Right Shoulder Radiating: sometimes radiates through upper arm Onset: More than a month ago Duration: Chronic pain Quality: Constant, Sharp, Shooting Severity: 7 /10 (subjective, self-reported pain score)  Note: Reported level is inconsistent with clinical observations. Clinically the patient looks like a 4/10 A 4/10 is viewed as "Moderately Severe" and described as impossible to ignore for more than a few minutes. With effort, patients may still be able to manage work or participate in some social activities. Very difficult to concentrate. Signs of autonomic nervous system discharge are evident: dilated pupils (mydriasis); mild sweating (diaphoresis); sleep interference. Heart rate becomes elevated (>115 bpm). Diastolic blood pressure (lower number) rises above 100 mmHg. Patients find relief in laying down and not moving. Information on the proper use of the pain scale provided to the patient today. When using our objective Pain Scale, levels  between 6 and 10/10 are said to belong in an emergency room, as it progressively worsens from a 6/10, described as severely limiting, requiring emergency care not usually available at an outpatient pain management facility. At a 6/10 level, communication becomes difficult and requires great effort. Assistance to reach the emergency department may be required. Facial flushing and profuse sweating along with potentially dangerous increases in heart rate and blood pressure will be evident. Effect on ADL: "unable to pick up anything heavier than 2# with right arm" Timing: Constant Modifying factors: capsaicin cream BP: 124/88  HR: 60  Mathew Robertson was last scheduled for an appointment on 08/25/2017 for medication management. During today's appointment we reviewed Mathew Robertson's chronic pain status, as well as his outpatient medication regimen.  Patient follows up today in regards to his right-sided neck pain that radiates down his right arm.  Today his x-ray results were reviewed which are below.  Patient states that he was not able to afford Lyrica.  Patient is not a candidate for chronic opioid therapy given his concomitant use of chronic Xanax.  We will focus primarily on interventional therapies.  The patient  reports that he does not use drugs. His body mass index is 29.57 kg/m.  Further details on both, my assessment(s), as well as the proposed treatment plan, please see below.  Laboratory Chemistry  Inflammation Markers (CRP: Acute Phase) (ESR: Chronic Phase) Lab Results  Component Value Date   CRP 3.7 12/22/2016   ESRSEDRATE 12 12/22/2016                         Rheumatology Markers No results found for: RF, ANA, [place], [place], [place], [place], HLAB27  Renal Function Markers Lab Results  Component Value Date   BUN 22 12/22/2016   CREATININE 1.12 12/22/2016   BCR 20 12/22/2016   GFRAA 87 12/22/2016   GFRNONAA 76 12/22/2016                              Hepatic Function Markers Lab Results  Component Value Date   AST 16 12/22/2016   ALT 12 12/22/2016   ALBUMIN 4.8 12/22/2016   ALKPHOS 115 12/22/2016                        Electrolytes Lab Results  Component Value Date   NA 146 (H) 12/22/2016   K 3.8 12/22/2016   CL 104 12/22/2016   CALCIUM 9.1 12/22/2016   MG 2.2 12/22/2016                        Neuropathy Markers Lab Results  Component Value Date   VITAMINB12 280 12/22/2016                        CNS Tests No results found for: SDES, GRAMSTAIN, CULT, COLORCSF, APPEARCSF, RBCCOUNTCSF, WBCCSF, POLYSCSF, LYMPHSCSF, EOSCSF, PROTEINCSF, GLUCCSF, JCVIRUS, CSFOLI, IGGCSF, IGGALB, IGGIND                      Bone Pathology Markers Lab Results  Component Value Date   25OHVITD1 25 (L) 12/22/2016   25OHVITD2 <1.0 12/22/2016   25OHVITD3 25 12/22/2016                         Coagulation Parameters No results found for: INR, LABPROT, APTT, PLT, DDIMER                      Cardiovascular Markers No results found for: BNP, CKTOTAL, CKMB, TROPONINI, HGB, HCT                       CA Markers No results found for: CEA, CA125, LABCA2                      Note: Lab results reviewed.  Recent Diagnostic Imaging Results  DG Shoulder Right CLINICAL DATA:  Chronic pain.  EXAM: RIGHT SHOULDER - 2+ VIEW  COMPARISON:  No prior.  FINDINGS: No acute bony or joint abnormality identified. No evidence of fracture or dislocation.  IMPRESSION: No acute abnormality.  Electronically Signed   By: Marcello Moores  Register   On: 08/27/2017 10:58 DG Cervical Spine With Flex & Extend CLINICAL DATA:  Chronic neck pain radiating to right shoulder  EXAM: CERVICAL SPINE COMPLETE WITH FLEXION AND EXTENSION VIEWS  COMPARISON:  None.  FINDINGS: Moderate to advanced degenerative disc disease at C6-7 with disc space narrowing and spurring anteriorly. No subluxation. Moderate multilevel bilateral neural foraminal narrowing due to  degenerative facet disease and uncovertebral spurring. No fracture. Prevertebral soft tissues are normal.  IMPRESSION: Degenerative disc disease in the lower cervical spine. Diffuse degenerative facet disease bilaterally with diffuse multilevel bilateral neural foraminal narrowing. No acute bony abnormality.  Electronically Signed   By: Rolm Baptise M.D.   On: 08/27/2017 10:57  Complexity Note: Imaging results reviewed. Results shared with Mathew Robertson, using Layman's terms. Today I personally and independently reviewed the study images pertinent to Mathew Robertson's  problem.            I have personally examined the images and I agree with the reported  findings. I find no additional pain-related pathology to add to the report.  Meds   Current Outpatient Medications:  .  ADVAIR DISKUS 250-50 MCG/DOSE AEPB, Take 1 puff by mouth as needed., Disp: , Rfl: 0 .  ALPRAZolam (XANAX) 1 MG tablet, Take 1 tablet by mouth 3 (three) times daily., Disp: , Rfl: 0 .  ASPIRIN LOW DOSE 81 MG EC tablet, Take 1 tablet by mouth daily., Disp: , Rfl: 3 .  capsaicin (ZOSTRIX) 0.025 % cream, Apply topically 2 (two) times daily., Disp: , Rfl:  .  hydrochlorothiazide (HYDRODIURIL) 25 MG tablet, Take 1 tablet by mouth daily., Disp: , Rfl: 2 .  nortriptyline (PAMELOR) 25 MG capsule, Take 2 capsules by mouth at bedtime as needed., Disp: , Rfl: 0 .  omeprazole (PRILOSEC) 40 MG capsule, TK ONE C PO QD, Disp: , Rfl: 0 .  Oxcarbazepine (TRILEPTAL) 300 MG tablet, Take 300 mg by mouth 2 (two) times daily., Disp: , Rfl:  .  PROAIR HFA 108 (90 Base) MCG/ACT inhaler, Take 2 puffs by mouth 4 (four) times daily as needed., Disp: , Rfl: 1 .  topiramate (TOPAMAX) 50 MG tablet, Take 100 mg by mouth 2 (two) times daily. 1 tablet in the am and 2 tablets nightly., Disp: , Rfl: 0 .  VIMPAT 200 MG TABS tablet, Take 1 tablet by mouth 2 (two) times daily., Disp: , Rfl: 0 .  acetaminophen-codeine (TYLENOL #3) 300-30 MG tablet, Take 1 tablet by  mouth every 8 (eight) hours as needed., Disp: , Rfl: 0  ROS  Constitutional: Denies any fever or chills Gastrointestinal: No reported hemesis, hematochezia, vomiting, or acute GI distress Musculoskeletal: Denies any acute onset joint swelling, redness, loss of ROM, or weakness Neurological: No reported episodes of acute onset apraxia, aphasia, dysarthria, agnosia, amnesia, paralysis, loss of coordination, or loss of consciousness  Allergies  Mathew Robertson is allergic to dilantin [phenytoin] and sulfa antibiotics.  [place]  Drug: Mathew Robertson  reports that he does not use drugs. Alcohol:  reports that he drank alcohol. Tobacco:  reports that he has been smoking cigarettes. He has a 5.00 pack-year smoking history. He has never used smokeless tobacco. Medical:  has a past medical history of Asthma, Chronic pain disorder, Headache, Hypertension, and Seizures ([place]). Surgical: Mathew Robertson  has a past surgical history that includes Back surgery; cosmetic eye surgery; Foot surgery; Nose surgery; Vasectomy; Esophagogastroduodenoscopy (egd) with propofol (N/A, 06/05/2017); and Colonoscopy with propofol (N/A, 06/05/2017). Family: family history includes Arthritis in his mother; Cancer in his mother; Mental illness in his mother.  Constitutional Exam  General appearance: alert, cooperative and in mild distress Vitals:   09/17/17 1004  BP: 124/88  Pulse: 60  Resp: 18  Temp: 98.5 F (36.9 C)  TempSrc: Oral  SpO2: 98%  Weight: 212 lb (96.2 kg)  Height: 5' 11" (1.803 m)   BMI Assessment: Estimated body mass index is 29.57 kg/m as calculated from the following:   Height as of this encounter: 5' 11" (1.803 m).   Weight as of this encounter: 212 lb (96.2 kg).  BMI interpretation table: BMI level Category Range association with higher incidence of chronic pain  <18 kg/m2 Underweight   18.5-24.9 kg/m2 Ideal body weight   25-29.9 kg/m2 Overweight Increased incidence by 20%  30-34.9 kg/m2 Obese (Class I)  Increased incidence by 68%  35-39.9  kg/m2 Severe obesity (Class II) Increased incidence by 136%  >40 kg/m2 Extreme obesity (Class III) Increased incidence by 254%   Patient's current BMI Ideal Body weight  Body mass index is 29.57 kg/m. Ideal body weight: 75.3 kg (166 lb 0.1 oz) Adjusted ideal body weight: 83.6 kg (184 lb 6.5 oz)   BMI Readings from Last 4 Encounters:  09/17/17 29.57 kg/m  08/25/17 28.73 kg/m  08/09/17 29.01 kg/m  06/30/17 29.43 kg/m   Wt Readings from Last 4 Encounters:  09/17/17 212 lb (96.2 kg)  08/25/17 206 lb (93.4 kg)  08/09/17 208 lb (94.3 kg)  06/30/17 211 lb (95.7 kg)  Psych/Mental status: Alert, oriented x 3 (person, place, & time)       Eyes: PERLA Respiratory: No evidence of acute respiratory distress  Cervical Spine Area Exam  Skin & Axial Inspection: No masses, redness, edema, swelling, or associated skin lesions Alignment: Symmetrical Functional ROM: Decreased ROM, to the right Stability: No instability detected Muscle Tone/Strength: Functionally intact. No obvious neuro-muscular anomalies detected. Sensory (Neurological): Dermatomal pain pattern Palpation: No palpable anomalies             Positive Spurling's on the right Upper Extremity (UE) Exam    Side: Right upper extremity  Side: Left upper extremity  Skin & Extremity Inspection: Skin color, temperature, and hair growth are WNL. No peripheral edema or cyanosis. No masses, redness, swelling, asymmetry, or associated skin lesions. No contractures.  Skin & Extremity Inspection: Skin color, temperature, and hair growth are WNL. No peripheral edema or cyanosis. No masses, redness, swelling, asymmetry, or associated skin lesions. No contractures.  Functional ROM: Decreased ROM for all joints of upper extremity  Functional ROM: Unrestricted ROM          Muscle Tone/Strength: Functionally intact. No obvious neuro-muscular anomalies detected.  Muscle Tone/Strength: Functionally intact. No obvious  neuro-muscular anomalies detected.  Sensory (Neurological): Dermatomal pain pattern          Sensory (Neurological): Unimpaired          Palpation: No palpable anomalies              Palpation: No palpable anomalies              Provocative Test(s):  Phalen's test: deferred Tinel's test: deferred Apley's scratch test (touch opposite shoulder):  Action 1 (Across chest): Decreased ROM Action 2 (Overhead): Decreased ROM Action 3 (LB reach): Decreased ROM   Provocative Test(s):  Phalen's test: deferred Tinel's test: deferred Apley's scratch test (touch opposite shoulder):  Action 1 (Across chest): deferred Action 2 (Overhead): deferred Action 3 (LB reach): deferred    Thoracic Spine Area Exam  Skin & Axial Inspection: No masses, redness, or swelling Alignment: Symmetrical Functional ROM: Unrestricted ROM Stability: No instability detected Muscle Tone/Strength: Functionally intact. No obvious neuro-muscular anomalies detected. Sensory (Neurological): Unimpaired Muscle strength & Tone: No palpable anomalies  Lumbar Spine Area Exam  Skin & Axial Inspection: No masses, redness, or swelling Alignment: Symmetrical Functional ROM: Unrestricted ROM       Stability: No instability detected Muscle Tone/Strength: Functionally intact. No obvious neuro-muscular anomalies detected. Sensory (Neurological): Unimpaired Palpation: No palpable anomalies       Provocative Tests: Hyperextension/rotation test: deferred today       Lumbar quadrant test (Kemp's test): deferred today       Lateral bending test: deferred today       Patrick's Maneuver: deferred today  FABER test: deferred today                   S-I anterior distraction/compression test: deferred today         S-I lateral compression test: deferred today         S-I Thigh-thrust test: deferred today         S-I Gaenslen's test: deferred today          Gait & Posture Assessment  Ambulation: Unassisted Gait:  Relatively normal for age and body habitus Posture: WNL   Lower Extremity Exam    Side: Right lower extremity  Side: Left lower extremity  Stability: No instability observed          Stability: No instability observed          Skin & Extremity Inspection: Skin color, temperature, and hair growth are WNL. No peripheral edema or cyanosis. No masses, redness, swelling, asymmetry, or associated skin lesions. No contractures.  Skin & Extremity Inspection: Skin color, temperature, and hair growth are WNL. No peripheral edema or cyanosis. No masses, redness, swelling, asymmetry, or associated skin lesions. No contractures.  Functional ROM: Unrestricted ROM                  Functional ROM: Unrestricted ROM                  Muscle Tone/Strength: Functionally intact. No obvious neuro-muscular anomalies detected.  Muscle Tone/Strength: Functionally intact. No obvious neuro-muscular anomalies detected.  Sensory (Neurological): Unimpaired  Sensory (Neurological): Unimpaired  Palpation: No palpable anomalies  Palpation: No palpable anomalies   Assessment  Primary Diagnosis & Pertinent Problem List: The primary encounter diagnosis was Right cervical radiculopathy. Diagnoses of Foraminal stenosis of cervical region (R), Osteoarthritis of spine with radiculopathy, cervical region, Cervicalgia, and Chronic pain syndrome were also pertinent to this visit.  Status Diagnosis  Worsening Worsening Worsening 1. Right cervical radiculopathy   2. Foraminal stenosis of cervical region (R)   3. Osteoarthritis of spine with radiculopathy, cervical region   4. Cervicalgia   5. Chronic pain syndrome     Problems updated and reviewed during this visit: Problem  Right Cervical Radiculopathy  Foraminal stenosis of cervical region (R)  Osteoarthritis of Spine With Radiculopathy, Cervical Region   52 year old male who follows up with worsening right neck pain that radiates down his right arm forearm and right hand  secondary to right cervical radiculopathy.  Patient has pain in a dermatomal fashion that is related to the C5, C6, C7 dermatomal distribution.  He has weakness with flexion and extension of his bicep.  There is concern of myelopathy.  For this reason I would like to obtain a cervical MRI without contrast to further evaluate his radicular symptoms.  We also discussed performing a right-sided C7-T1 cervical ESI.  Will be done after his cervical MRI the patient signs and symptoms correlate with right cervical radiculopathy from cervical foraminal stenosis.  Patient's x-rays revealed diffuse cervical facet disease with multilevel bilateral neuroforaminal narrowing.  Plan: -Given concern for worsening radiculopathy and possible myelopathy, recommend MRI cervical spine without contrast -Plan for cervical ESI for cervical radicular symptoms.  Plan of Care   Lab-work, procedure(s), and/or referral(s): Orders Placed This Encounter  Procedures  . Cervical Epidural Injection  . MR CERVICAL SPINE WO CONTRAST   Provider-requested follow-up: Return in about 4 weeks (around 10/15/2017) for Procedure.  Future Appointments  Date Time Provider [place]  10/14/2017  1:00 PM Gillis Santa,  MD [facility] None   Time Note: Greater than 50% of the 25 minute(s) of face-to-face time spent with Mathew Robertson, was spent in counseling/coordination of care regarding: Mathew Robertson's primary cause of pain, the results of his recent test(s), the significance of each one oth the test(s) anomalies and it's corresponding characteristic pain pattern(s), the treatment plan, treatment alternatives, the risks and possible complications of proposed treatment, going over the informed consent and realistic expectations.  Primary Care Physician: [facility] [place] Location: [facility] Outpatient Pain Management Facility Note by: Gillis Santa, M.D Date: 09/17/2017; Time: 2:05 PM  Patient Instructions   Moderate  Conscious Sedation, Adult Sedation is the use of medicines to promote relaxation and relieve discomfort and anxiety. Moderate conscious sedation is a type of sedation. Under moderate conscious sedation, you are less alert than normal, but you are still able to respond to instructions, touch, or both. Moderate conscious sedation is used during short medical and dental procedures. It is milder than deep sedation, which is a type of sedation under which you cannot be easily woken up. It is also milder than general anesthesia, which is the use of medicines to make you unconscious. Moderate conscious sedation allows you to return to your regular activities sooner. Tell a health care provider about:  Any allergies you have.  All medicines you are taking, including vitamins, herbs, eye drops, creams, and over-the-counter medicines.  Use of steroids (by mouth or creams).  Any problems you or family members have had with sedatives and anesthetic medicines.  Any blood disorders you have.  Any surgeries you have had.  Any medical conditions you have, such as sleep apnea.  Whether you are pregnant or may be pregnant.  Any use of cigarettes, alcohol, marijuana, or street drugs. What are the risks? Generally, this is a safe procedure. However, problems may occur, including:  Getting too much medicine (oversedation).  Nausea.  Allergic reaction to medicines.  Trouble breathing. If this happens, a breathing tube may be used to help with breathing. It will be removed when you are awake and breathing on your own.  Heart trouble.  Lung trouble.  What happens before the procedure? Staying hydrated Follow instructions from your health care provider about hydration, which may include:  Up to 2 hours before the procedure - you may continue to drink clear liquids, such as water, clear fruit juice, black coffee, and plain tea.  Eating and drinking restrictions Follow instructions from your health  care provider about eating and drinking, which may include:  8 hours before the procedure - stop eating heavy meals or foods such as meat, fried foods, or fatty foods.  6 hours before the procedure - stop eating light meals or foods, such as toast or cereal.  6 hours before the procedure - stop drinking milk or drinks that contain milk.  2 hours before the procedure - stop drinking clear liquids.  Medicine  Ask your health care provider about:  Changing or stopping your regular medicines. This is especially important if you are taking diabetes medicines or blood thinners.  Taking medicines such as aspirin and ibuprofen. These medicines can thin your blood. Do not take these medicines before your procedure if your health care provider instructs you not to.  Tests and exams  You will have a physical exam.  You may have blood tests done to show: ? How well your kidneys and liver are working. ? How well your blood can clot. General instructions  Plan to have  someone take you home from the hospital or clinic.  If you will be going home right after the procedure, plan to have someone with you for 24 hours. What happens during the procedure?  An IV tube will be inserted into one of your veins.  Medicine to help you relax (sedative) will be given through the IV tube.  The medical or dental procedure will be performed. What happens after the procedure?  Your blood pressure, heart rate, breathing rate, and blood oxygen level will be monitored often until the medicines you were given have worn off.  Do not drive for 24 hours. This information is not intended to replace advice given to you by your health care provider. Make sure you discuss any questions you have with your health care provider. Document Released: 09/17/2000 Document Revised: 05/29/2015 Document Reviewed: 04/14/2015 Elsevier Interactive Patient Education  2018 [employer]  What are  the risk, side effects and possible complications? Generally speaking, most procedures are safe.  However, with any procedure there are risks, side effects, and the possibility of complications.  The risks and complications are dependent upon the sites that are lesioned, or the type of nerve block to be performed.  The closer the procedure is to the spine, the more serious the risks are.  Great care is taken when placing the radio frequency needles, block needles or lesioning probes, but sometimes complications can occur. 1. Infection: Any time there is an injection through the skin, there is a risk of infection.  This is why sterile conditions are used for these blocks.  There are four possible types of infection. 1. Localized skin infection. 2. Central Nervous System Infection-This can be in the form of Meningitis, which can be deadly. 3. Epidural Infections-This can be in the form of an epidural abscess, which can cause pressure inside of the spine, causing compression of the spinal cord with subsequent paralysis. This would require an emergency surgery to decompress, and there are no guarantees that the patient would recover from the paralysis. 4. Discitis-This is an infection of the intervertebral discs.  It occurs in about 1% of discography procedures.  It is difficult to treat and it may lead to surgery.        2. Pain: the needles have to go through skin and soft tissues, will cause soreness.       3. Damage to internal structures:  The nerves to be lesioned may be near blood vessels or    other nerves which can be potentially damaged.       4. Bleeding: Bleeding is more common if the patient is taking blood thinners such as  aspirin, Coumadin, Ticiid, Plavix, etc., or if he/she have some genetic predisposition  such as hemophilia. Bleeding into the spinal canal can cause compression of the spinal  cord with subsequent paralysis.  This would require an emergency surgery to  decompress and there  are no guarantees that the patient would recover from the  paralysis.       5. Pneumothorax:  Puncturing of a lung is a possibility, every time a needle is introduced in  the area of the chest or upper back.  Pneumothorax refers to free air around the  collapsed lung(s), inside of the thoracic cavity (chest cavity).  Another two possible  complications related to a similar event would include: Hemothorax and Chylothorax.   These are variations of the Pneumothorax, where instead of air around the collapsed  lung(s), you  may have blood or chyle, respectively.       6. Spinal headaches: They may occur with any procedures in the area of the spine.       7. Persistent CSF (Cerebro-Spinal Fluid) leakage: This is a rare problem, but may occur  with prolonged intrathecal or epidural catheters either due to the formation of a fistulous  track or a dural tear.       8. Nerve damage: By working so close to the spinal cord, there is always a possibility of  nerve damage, which could be as serious as a permanent spinal cord injury with  paralysis.       9. Death:  Although rare, severe deadly allergic reactions known as "Anaphylactic  reaction" can occur to any of the medications used.      10. Worsening of the symptoms:  We can always make thing worse.  What are the chances of something like this happening? Chances of any of this occuring are extremely low.  By statistics, you have more of a chance of getting killed in a motor vehicle accident: while driving to the hospital than any of the above occurring .  Nevertheless, you should be aware that they are possibilities.  In general, it is similar to taking a shower.  Everybody knows that you can slip, hit your head and get killed.  Does that mean that you should not shower again?  Nevertheless always keep in mind that statistics do not mean anything if you happen to be on the wrong side of them.  Even if a procedure has a 1 (one) in a 1,000,000 (million) chance of  going wrong, it you happen to be that one..Also, keep in mind that by statistics, you have more of a chance of having something go wrong when taking medications.  Who should not have this procedure? If you are on a blood thinning medication (e.g. Coumadin, Plavix, see list of "Blood Thinners"), or if you have an active infection going on, you should not have the procedure.  If you are taking any blood thinners, please inform your physician.  How should I prepare for this procedure?  Do not eat or drink anything at least six hours prior to the procedure.  Bring a driver with you .  It cannot be a taxi.  Come accompanied by an adult that can drive you back, and that is strong enough to help you if your legs get weak or numb from the local anesthetic.  Take all of your medicines the morning of the procedure with just enough water to swallow them.  If you have diabetes, make sure that you are scheduled to have your procedure done first thing in the morning, whenever possible.  If you have diabetes, take only half of your insulin dose and notify our nurse that you have done so as soon as you arrive at the clinic.  If you are diabetic, but only take blood sugar pills (oral hypoglycemic), then do not take them on the morning of your procedure.  You may take them after you have had the procedure.  Do not take aspirin or any aspirin-containing medications, at least eleven (11) days prior to the procedure.  They may prolong bleeding.  Wear loose fitting clothing that may be easy to take off and that you would not mind if it got stained with Betadine or blood.  Do not wear any jewelry or perfume  Remove any nail coloring.  It will interfere with  some of our monitoring equipment.  NOTE: Remember that this is not meant to be interpreted as a complete list of all possible complications.  Unforeseen problems may occur.  BLOOD THINNERS The following drugs contain aspirin or other products, which can  cause increased bleeding during surgery and should not be taken for 2 weeks prior to and 1 week after surgery.  If you should need take something for relief of minor pain, you may take acetaminophen which is found in Tylenol,m Datril, Anacin-3 and Panadol. It is not blood thinner. The products listed below are.  Do not take any of the products listed below in addition to any listed on your instruction sheet.  A.P.C or A.P.C with Codeine Codeine Phosphate Capsules #3 Ibuprofen Ridaura  ABC compound Congesprin Imuran rimadil  Advil Cope Indocin Robaxisal  Alka-Seltzer Effervescent Pain Reliever and Antacid Coricidin or Coricidin-D  Indomethacin Rufen  Alka-Seltzer plus Cold Medicine Cosprin Ketoprofen S-A-C Tablets  Anacin Analgesic Tablets or Capsules Coumadin Korlgesic Salflex  Anacin Extra Strength Analgesic tablets or capsules CP-2 Tablets Lanoril Salicylate  Anaprox Cuprimine Capsules Levenox Salocol  Anexsia-D Dalteparin Magan Salsalate  Anodynos Darvon compound Magnesium Salicylate Sine-off  Ansaid Dasin Capsules Magsal Sodium Salicylate  Anturane Depen Capsules Marnal Soma  APF Arthritis pain formula Dewitt's Pills Measurin Stanback  Argesic Dia-Gesic Meclofenamic Sulfinpyrazone  Arthritis Bayer Timed Release Aspirin Diclofenac Meclomen Sulindac  Arthritis pain formula Anacin Dicumarol Medipren Supac  Analgesic (Safety coated) Arthralgen Diffunasal Mefanamic Suprofen  Arthritis Strength Bufferin Dihydrocodeine Mepro Compound Suprol  Arthropan liquid Dopirydamole Methcarbomol with Aspirin Synalgos  ASA tablets/Enseals Disalcid Micrainin Tagament  Ascriptin Doan's Midol Talwin  Ascriptin A/D Dolene Mobidin Tanderil  Ascriptin Extra Strength Dolobid Moblgesic Ticlid  Ascriptin with Codeine Doloprin or Doloprin with Codeine Momentum Tolectin  Asperbuf Duoprin Mono-gesic Trendar  Aspergum Duradyne Motrin or Motrin IB Triminicin  Aspirin plain, buffered or enteric coated Durasal  Myochrisine Trigesic  Aspirin Suppositories Easprin Nalfon Trillsate  Aspirin with Codeine Ecotrin Regular or Extra Strength Naprosyn Uracel  Atromid-S Efficin Naproxen Ursinus  Auranofin Capsules Elmiron Neocylate Vanquish  Axotal Emagrin Norgesic Verin  Azathioprine Empirin or Empirin with Codeine Normiflo Vitamin E  Azolid Emprazil Nuprin Voltaren  Bayer Aspirin plain, buffered or children's or timed BC Tablets or powders Encaprin Orgaran Warfarin Sodium  Buff-a-Comp Enoxaparin Orudis Zorpin  Buff-a-Comp with Codeine Equegesic Os-Cal-Gesic   Buffaprin Excedrin plain, buffered or Extra Strength Oxalid   Bufferin Arthritis Strength Feldene Oxphenbutazone   Bufferin plain or Extra Strength Feldene Capsules Oxycodone with Aspirin   Bufferin with Codeine Fenoprofen Fenoprofen Pabalate or Pabalate-SF   Buffets II Flogesic Panagesic   Buffinol plain or Extra Strength Florinal or Florinal with Codeine Panwarfarin   Buf-Tabs Flurbiprofen Penicillamine   Butalbital Compound Four-way cold tablets Penicillin   Butazolidin Fragmin Pepto-Bismol   Carbenicillin Geminisyn Percodan   Carna Arthritis Reliever Geopen Persantine   Carprofen Gold's salt Persistin   Chloramphenicol Goody's Phenylbutazone   Chloromycetin Haltrain Piroxlcam   Clmetidine heparin Plaquenil   Cllnoril Hyco-pap Ponstel   Clofibrate Hydroxy chloroquine Propoxyphen         Before stopping any of these medications, be sure to consult the physician who ordered them.  Some, such as Coumadin (Warfarin) are ordered to prevent or treat serious conditions such as "deep thrombosis", "pumonary embolisms", and other heart problems.  The amount of time that you may need off of the medication may also vary with the medication and the reason for which you were taking it.  If you are taking any of these medications, please make sure you notify your pain physician before you undergo any procedures.         Epidural Steroid  Injection Patient Information  Description: The epidural space surrounds the nerves as they exit the spinal cord.  In some patients, the nerves can be compressed and inflamed by a bulging disc or a tight spinal canal (spinal stenosis).  By injecting steroids into the epidural space, we can bring irritated nerves into direct contact with a potentially helpful medication.  These steroids act directly on the irritated nerves and can reduce swelling and inflammation which often leads to decreased pain.  Epidural steroids may be injected anywhere along the spine and from the neck to the low back depending upon the location of your pain.   After numbing the skin with local anesthetic (like Novocaine), a small needle is passed into the epidural space slowly.  You may experience a sensation of pressure while this is being done.  The entire block usually last less than 10 minutes.  Conditions which may be treated by epidural steroids:   Low back and leg pain  Neck and arm pain  Spinal stenosis  Post-laminectomy syndrome  Herpes zoster (shingles) pain  Pain from compression fractures  Preparation for the injection:  1. Do not eat any solid food or dairy products within 8 hours of your appointment.  2. You may drink clear liquids up to 3 hours before appointment.  Clear liquids include water, black coffee, juice or soda.  No milk or cream please. 3. You may take your regular medication, including pain medications, with a sip of water before your appointment  Diabetics should hold regular insulin (if taken separately) and take 1/2 normal NPH dos the morning of the procedure.  Carry some sugar containing items with you to your appointment. 4. A driver must accompany you and be prepared to drive you home after your procedure.  5. Bring all your current medications with your. 6. An IV may be inserted and sedation may be given at the discretion of the physician.   7. A blood pressure cuff, EKG and other  monitors will often be applied during the procedure.  Some patients may need to have extra oxygen administered for a short period. 8. You will be asked to provide medical information, including your allergies, prior to the procedure.  We must know immediately if you are taking blood thinners (like Coumadin/Warfarin)  Or if you are allergic to IV iodine contrast (dye). We must know if you could possible be pregnant.  Possible side-effects:  Bleeding from needle site  Infection (rare, may require surgery)  Nerve injury (rare)  Numbness & tingling (temporary)  Difficulty urinating (rare, temporary)  Spinal headache ( a headache worse with upright posture)  Light -headedness (temporary)  Pain at injection site (several days)  Decreased blood pressure (temporary)  Weakness in arm/leg (temporary)  Pressure sensation in back/neck (temporary)  Call if you experience:  Fever/chills associated with headache or increased back/neck pain.  Headache worsened by an upright position.  New onset weakness or numbness of an extremity below the injection site  Hives or difficulty breathing (go to the emergency room)  Inflammation or drainage at the infection site  Severe back/neck pain  Any new symptoms which are concerning to you  Please note:  Although the local anesthetic injected can often make your back or neck feel good for several hours after the injection, the pain will  likely return.  It takes 3-7 days for steroids to work in the epidural space.  You may not notice any pain relief for at least that one week.  If effective, we will often do a series of three injections spaced 3-6 weeks apart to maximally decrease your pain.  After the initial series, we generally will wait several months before considering a repeat injection of the same type.  If you have any questions, please call [phone] [facility]

## 2017-09-17 NOTE — Progress Notes (Signed)
Safety precautions to be maintained throughout the outpatient stay will include: orient to surroundings, keep bed in low position, maintain call bell within reach at all times, provide assistance with transfer out of bed and ambulation.  

## 2017-10-13 ENCOUNTER — Telehealth: Payer: Self-pay | Admitting: *Deleted

## 2017-10-14 ENCOUNTER — Ambulatory Visit: Payer: Medicaid Other | Admitting: Student in an Organized Health Care Education/Training Program

## 2017-10-14 ENCOUNTER — Other Ambulatory Visit: Payer: Self-pay | Admitting: *Deleted

## 2017-10-14 ENCOUNTER — Ambulatory Visit: Admission: RE | Admit: 2017-10-14 | Payer: Medicaid Other | Source: Ambulatory Visit

## 2017-10-14 DIAGNOSIS — M5412 Radiculopathy, cervical region: Secondary | ICD-10-CM

## 2017-10-14 MED ORDER — ROPIVACAINE HCL 2 MG/ML IJ SOLN: 1.0000 mL | Freq: Once | INTRAMUSCULAR | Status: DC

## 2017-10-14 MED ORDER — FENTANYL CITRATE (PF) 100 MCG/2ML IJ SOLN: 25.0000 ug | INTRAMUSCULAR | Status: AC | PRN

## 2017-10-14 MED ORDER — LACTATED RINGERS IV SOLN: 1000.0000 mL | Freq: Once | INTRAVENOUS | Status: DC

## 2017-10-14 MED ORDER — LIDOCAINE HCL 2 % IJ SOLN: 10.0000 mL | Freq: Once | INTRAMUSCULAR | Status: DC

## 2017-10-14 MED ORDER — IOPAMIDOL (ISOVUE-M 200) INJECTION 41%: 10.0000 mL | Freq: Once | INTRAMUSCULAR | Status: DC

## 2017-10-14 MED ORDER — SODIUM CHLORIDE 0.9% FLUSH: 1.0000 mL | Freq: Once | INTRAVENOUS | Status: DC

## 2017-10-14 MED ORDER — DEXAMETHASONE SODIUM PHOSPHATE 10 MG/ML IJ SOLN: 10.0000 mg | Freq: Once | INTRAMUSCULAR | Status: DC

## 2017-10-14 NOTE — Progress Notes (Deleted)
Patient's Name: Mathew Robertson  MRN: [id number]  Referring Provider: [facility]  DOB: 04-13-1965  PCP: [facility] [place]  DOS: 10/14/2017  Note by: Gillis Santa, MD  Service setting: Ambulatory outpatient  Specialty: Interventional Pain Management  Patient type: Established  Location: [facility] (AMB) Pain Management Facility  Visit type: Interventional Procedure   Primary Reason for Visit: Interventional Pain Management Treatment. CC: No chief complaint on file.  Procedure:          Anesthesia, Analgesia, Anxiolysis:  Type: Diagnostic, Inter-Laminar, Epidural Steroid Injection  #1  Region: Posterior Cervico-thoracic Region Level: C7-T1 Laterality: Right-Sided Paramedial  Type: Moderate (Conscious) Sedation combined with Local Anesthesia Indication(s): Analgesia and Anxiety Route: Intravenous (IV) IV Access: Secured Sedation: Meaningful verbal contact was maintained at all times during the procedure  Local Anesthetic: Lidocaine 1-2%  Position: Prone with head of the table was raised to facilitate breathing.   Indications: No diagnosis found. Pain Score: Pre-procedure:  /10 Post-procedure:  /10  Pre-op Assessment:  Mathew Robertson is a 52 y.o. (year old), male patient, seen today for interventional treatment. He  has a past surgical history that includes Back surgery; cosmetic eye surgery; Foot surgery; Nose surgery; Vasectomy; Esophagogastroduodenoscopy (egd) with propofol (N/A, 06/05/2017); and Colonoscopy with propofol (N/A, 06/05/2017). Mathew Robertson has a current medication list which includes the following prescription(s): acetaminophen-codeine, advair diskus, alprazolam, aspirin low dose, capsaicin, hydrochlorothiazide, nortriptyline, omeprazole, oxcarbazepine, proair hfa, topiramate, and vimpat. His primarily concern today is the No chief complaint on file.  Initial Vital Signs:  Pulse/HCG Rate:    Temp:   Resp:   BP:   SpO2:    BMI: Estimated body mass index is  29.57 kg/m as calculated from the following:   Height as of 09/17/17: 5\' 11"  (1.803 m).   Weight as of 09/17/17: 212 lb (96.2 kg).  Risk Assessment: Allergies: Reviewed. He is allergic to dilantin [phenytoin] and sulfa antibiotics.  Allergy Precautions: None required Coagulopathies: Reviewed. None identified.  Blood-thinner therapy: None at this time Active Infection(s): Reviewed. None identified. Mathew Robertson is afebrile  Site Confirmation: Mathew Robertson was asked to confirm the procedure and laterality before marking the site Procedure checklist: Completed Consent: Before the procedure and under the influence of no sedative(s), amnesic(s), or anxiolytics, the patient was informed of the treatment options, risks and possible complications. To fulfill our ethical and legal obligations, as recommended by the American Medical Association's Code of Ethics, I have informed the patient of my clinical impression; the nature and purpose of the treatment or procedure; the risks, benefits, and possible complications of the intervention; the alternatives, including doing nothing; the risk(s) and benefit(s) of the alternative treatment(s) or procedure(s); and the risk(s) and benefit(s) of doing nothing. The patient was provided information about the general risks and possible complications associated with the procedure. These may include, but are not limited to: failure to achieve desired goals, infection, bleeding, organ or nerve damage, allergic reactions, paralysis, and death. In addition, the patient was informed of those risks and complications associated to Spine-related procedures, such as failure to decrease pain; infection (i.e.: Meningitis, epidural or intraspinal abscess); bleeding (i.e.: epidural hematoma, subarachnoid hemorrhage, or any other type of intraspinal or peri-dural bleeding); organ or nerve damage (i.e.: Any type of peripheral nerve, nerve root, or spinal cord injury) with subsequent damage to  sensory, motor, and/or autonomic systems, resulting in permanent pain, numbness, and/or weakness of one or several areas of the body; allergic reactions; (i.e.: anaphylactic reaction); and/or death. Furthermore,  the patient was informed of those risks and complications associated with the medications. These include, but are not limited to: allergic reactions (i.e.: anaphylactic or anaphylactoid reaction(s)); adrenal axis suppression; blood sugar elevation that in diabetics may result in ketoacidosis or comma; water retention that in patients with history of congestive heart failure may result in shortness of breath, pulmonary edema, and decompensation with resultant heart failure; weight gain; swelling or edema; medication-induced neural toxicity; particulate matter embolism and blood vessel occlusion with resultant organ, and/or nervous system infarction; and/or aseptic necrosis of one or more joints. Finally, the patient was informed that Medicine is not an exact science; therefore, there is also the possibility of unforeseen or unpredictable risks and/or possible complications that may result in a catastrophic outcome. The patient indicated having understood very clearly. We have given the patient no guarantees and we have made no promises. Enough time was given to the patient to ask questions, all of which were answered to the patient's satisfaction. Mathew Robertson has indicated that he wanted to continue with the procedure. Attestation: I, the ordering provider, attest that I have discussed with the patient the benefits, risks, side-effects, alternatives, likelihood of achieving goals, and potential problems during recovery for the procedure that I have provided informed consent. Date  Time: {CHL [facility]-PAIN TIME CHOICES:21018001}  Pre-Procedure Preparation:  Monitoring: As per clinic protocol. Respiration, ETCO2, SpO2, BP, heart rate and rhythm monitor placed and checked for adequate function Safety  Precautions: Patient was assessed for positional comfort and pressure points before starting the procedure. Time-out: I initiated and conducted the "Time-out" before starting the procedure, as per protocol. The patient was asked to participate by confirming the accuracy of the "Time Out" information. Verification of the correct person, site, and procedure were performed and confirmed by me, the nursing staff, and the patient. "Time-out" conducted as per Joint Commission's Universal Protocol (UP.01.01.01). Time:    Description of Procedure:          Target Area: For Epidural Steroid injections the target is the interlaminar space, initially targeting the lower border of the superior vertebral body lamina. Approach: Paramedial approach. Area Prepped: Entire PosteriorCervical Region Prepping solution: ChloraPrep (2% chlorhexidine gluconate and 70% isopropyl alcohol) Safety Precautions: Aspiration looking for blood return was conducted prior to all injections. At no point did we inject any substances, as a needle was being advanced. No attempts were made at seeking any paresthesias. Safe injection practices and needle disposal techniques used. Medications properly checked for expiration dates. SDV (single dose vial) medications used. Description of the Procedure: Protocol guidelines were followed. The procedure needle was introduced through the skin, ipsilateral to the reported pain, and advanced to the target area. Bone was contacted and the needle walked caudad, until the lamina was cleared. The epidural space was identified using "loss-of-resistance technique" with 2-3 ml of PF-NaCl (0.9% NSS), in a 5cc LOR glass syringe. There were no vitals filed for this visit.  Start Time:   hrs. End Time:   hrs. Materials:  Needle(s) Type: Epidural needle Gauge: 17G Length: 3.5-in Medication(s): Please see orders for medications and dosing details.  Imaging Guidance (Spinal):          Type of Imaging  Technique: Fluoroscopy Guidance (Spinal) Indication(s): Assistance in needle guidance and placement for procedures requiring needle placement in or near specific anatomical locations not easily accessible without such assistance. Exposure Time: Please see nurses notes. Contrast: Before injecting any contrast, we confirmed that the patient did not have an allergy to  iodine, shellfish, or radiological contrast. Once satisfactory needle placement was completed at the desired level, radiological contrast was injected. Contrast injected under live fluoroscopy. No contrast complications. See chart for type and volume of contrast used. Fluoroscopic Guidance: I was personally present during the use of fluoroscopy. "Tunnel Vision Technique" used to obtain the best possible view of the target area. Parallax error corrected before commencing the procedure. "Direction-depth-direction" technique used to introduce the needle under continuous pulsed fluoroscopy. Once target was reached, antero-posterior, oblique, and lateral fluoroscopic projection used confirm needle placement in all planes. Images permanently stored in EMR. Interpretation: I personally interpreted the imaging intraoperatively. Adequate needle placement confirmed in multiple planes. Appropriate spread of contrast into desired area was observed. No evidence of afferent or efferent intravascular uptake. No intrathecal or subarachnoid spread observed. Permanent images saved into the patient's record.  Antibiotic Prophylaxis:   Anti-infectives (From admission, onward)   None     Indication(s): None identified  Post-operative Assessment:  Post-procedure Vital Signs:  Pulse/HCG Rate:    Temp:   Resp:   BP:   SpO2:    EBL: None  Complications: No immediate post-treatment complications observed by team, or reported by patient.  Note: The patient tolerated the entire procedure well. A repeat set of vitals were taken after the procedure and the  patient was kept under observation following institutional policy, for this type of procedure. Post-procedural neurological assessment was performed, showing return to baseline, prior to discharge. The patient was provided with post-procedure discharge instructions, including a section on how to identify potential problems. Should any problems arise concerning this procedure, the patient was given instructions to immediately contact us, at any time, without hesitation. In any case, we plan to contact the patient by telephone for a follow-up status report regarding this interventional procedure.  Comments:  No additional relevant information.  Plan of Care   Imaging Orders  No imaging studies ordered today   Procedure Orders    No procedure(s) ordered today    Medications ordered for procedure: No orders of the defined types were placed in this encounter.  Medications administered: Clovis Cao had no medications administered during this visit.  See the medical record for exact dosing, route, and time of administration.  Disposition: Discharge home  Discharge Date & Time: 10/14/2017;   hrs.   Physician-requested Follow-up: No follow-ups on file.  No future appointments. Primary Care Physician: [place] Location: [facility] Note by: Gillis Santa, MD Date: 10/14/2017; Time: 1:04 PM  Disclaimer:  Medicine is not an exact science. The only guarantee in medicine is that nothing is guaranteed. It is important to note that the decision to proceed with this intervention was based on the information collected from the patient. The Data and conclusions were drawn from the patient's questionnaire, the interview, and the physical examination. Because the information was provided in large part by the patient, it cannot be guaranteed that it has not been purposely or unconsciously manipulated. Every effort has been made to obtain as much relevant  data as possible for this evaluation. It is important to note that the conclusions that lead to this procedure are derived in large part from the available data. Always take into account that the treatment will also be dependent on availability of resources and existing treatment guidelines, considered by other Pain Management Practitioners as being common knowledge and practice, at the time of the intervention. For Medico-Legal purposes, it is also important to point out  that variation in procedural techniques and pharmacological choices are the acceptable norm. The indications, contraindications, technique, and results of the above procedure should only be interpreted and judged by a Board-Certified Interventional Pain Specialist with extensive familiarity and expertise in the same exact procedure and technique.

## 2017-10-16 ENCOUNTER — Telehealth: Payer: Self-pay | Admitting: *Deleted

## 2017-10-29 ENCOUNTER — Ambulatory Visit
Admission: RE | Admit: 2017-10-29 | Discharge: 2017-10-29 | Disposition: A | Discharge status: Home / Self Care | Payer: Medicaid Other | Source: Ambulatory Visit | Attending: Student in an Organized Health Care Education/Training Program | Admitting: Student in an Organized Health Care Education/Training Program

## 2017-10-29 DIAGNOSIS — M4802 Spinal stenosis, cervical region: Secondary | ICD-10-CM | POA: Insufficient documentation

## 2017-10-29 DIAGNOSIS — M50221 Other cervical disc displacement at C4-C5 level: Secondary | ICD-10-CM | POA: Insufficient documentation

## 2017-10-29 DIAGNOSIS — M5412 Radiculopathy, cervical region: Secondary | ICD-10-CM | POA: Diagnosis not present

## 2017-10-30 ENCOUNTER — Telehealth: Payer: Self-pay | Admitting: Student in an Organized Health Care Education/Training Program

## 2017-10-30 DIAGNOSIS — M5412 Radiculopathy, cervical region: Secondary | ICD-10-CM

## 2017-10-30 NOTE — Telephone Encounter (Signed)
Order placed for cervical ESI.  Orders Placed This Encounter  Procedures  . Cervical Epidural Injection    Standing Status:   Future    Standing Expiration Date:   11/30/2017    Scheduling Instructions:     Side: Midline     Level: C7-T1     Sedation: Patient's choice.     Timeframe: ASAA    Order Specific Question:   Where will this procedure be performed?    Answer:   [facility]

## 2017-10-30 NOTE — Telephone Encounter (Signed)
Patient had his MRI completed on 10-29-17. Wants to know how soon he can get set up for procedure?

## 2017-11-09 ENCOUNTER — Ambulatory Visit
Admission: RE | Admit: 2017-11-09 | Discharge: 2017-11-09 | Disposition: A | Discharge status: Home / Self Care | Payer: Medicaid Other | Source: Ambulatory Visit | Attending: Student in an Organized Health Care Education/Training Program | Admitting: Student in an Organized Health Care Education/Training Program

## 2017-11-09 ENCOUNTER — Other Ambulatory Visit: Payer: Self-pay

## 2017-11-09 ENCOUNTER — Encounter: Payer: Self-pay | Admitting: Student in an Organized Health Care Education/Training Program

## 2017-11-09 ENCOUNTER — Ambulatory Visit (HOSPITAL_BASED_OUTPATIENT_CLINIC_OR_DEPARTMENT_OTHER): Payer: Medicaid Other | Admitting: Student in an Organized Health Care Education/Training Program

## 2017-11-09 VITALS — BP 131/90 | HR 60 | Temp 97.6°F | Resp 16 | Ht 71.5 in | Wt 209.0 lb

## 2017-11-09 DIAGNOSIS — M5412 Radiculopathy, cervical region: Secondary | ICD-10-CM | POA: Diagnosis not present

## 2017-11-09 DIAGNOSIS — M542 Cervicalgia: Secondary | ICD-10-CM | POA: Diagnosis present

## 2017-11-09 DIAGNOSIS — Z9889 Other specified postprocedural states: Secondary | ICD-10-CM | POA: Diagnosis not present

## 2017-11-09 DIAGNOSIS — M4802 Spinal stenosis, cervical region: Secondary | ICD-10-CM

## 2017-11-09 DIAGNOSIS — Z882 Allergy status to sulfonamides status: Secondary | ICD-10-CM | POA: Diagnosis not present

## 2017-11-09 MED ORDER — ROPIVACAINE HCL 2 MG/ML IJ SOLN
1.0000 mL | Freq: Once | INTRAMUSCULAR | Status: AC
  Administered 2017-11-09: 10 mL via EPIDURAL
  Filled 2017-11-09: qty 10

## 2017-11-09 MED ORDER — LACTATED RINGERS IV SOLN: 1000.0000 mL | Freq: Once | INTRAVENOUS | Status: DC

## 2017-11-09 MED ORDER — DEXAMETHASONE SODIUM PHOSPHATE 10 MG/ML IJ SOLN
10.0000 mg | Freq: Once | INTRAMUSCULAR | Status: AC
  Administered 2017-11-09: 10 mg
  Filled 2017-11-09: qty 1

## 2017-11-09 MED ORDER — SODIUM CHLORIDE 0.9% FLUSH
1.0000 mL | Freq: Once | INTRAVENOUS | Status: AC
  Administered 2017-11-09: 1 mL

## 2017-11-09 MED ORDER — IOPAMIDOL (ISOVUE-M 200) INJECTION 41%
10.0000 mL | Freq: Once | INTRAMUSCULAR | Status: AC
  Administered 2017-11-09: 10 mL via EPIDURAL
  Filled 2017-11-09: qty 10

## 2017-11-09 MED ORDER — FENTANYL CITRATE (PF) 100 MCG/2ML IJ SOLN
25.0000 ug | INTRAMUSCULAR | Status: DC | PRN
  Filled 2017-11-09: qty 2

## 2017-11-09 MED ORDER — LIDOCAINE HCL 2 % IJ SOLN
10.0000 mL | Freq: Once | INTRAMUSCULAR | Status: AC
Start: 2017-11-09 — End: 2017-11-09
  Administered 2017-11-09: 400 mg
  Filled 2017-11-09: qty 20

## 2017-11-09 NOTE — Progress Notes (Signed)
Patient's Name: Erin Uecker  MRN: [id number]  Referring Provider: [facility]  DOB: 06-23-1965  PCP: [facility]  DOS: 11/09/2017  Note by: Gillis Santa, MD  Service setting: Ambulatory outpatient  Specialty: Interventional Pain Management  Patient type: Established  Location: [facility] (AMB) Pain Management Facility  Visit type: Interventional Procedure   Primary Reason for Visit: Interventional Pain Management Treatment. CC: Neck Pain (right)  Procedure:          Anesthesia, Analgesia, Anxiolysis:  Type: Diagnostic, Inter-Laminar, Epidural Steroid Injection  #1  Region: Posterior Cervico-thoracic Region Level: C7-T1 Laterality: Right-Sided Paramedial  Type: Moderate (Conscious) Sedation combined with Local Anesthesia Indication(s): Analgesia and Anxiety Route: Intravenous (IV) IV Access: Secured Sedation: Meaningful verbal contact was maintained at all times during the procedure  Local Anesthetic: Lidocaine 1-2%  Position: Prone with head of the table was raised to facilitate breathing.   Indications: 1. Right cervical radiculopathy   2. Foraminal stenosis of cervical region (R)    Pain Score: Pre-procedure: 7 /10 Post-procedure: 5 (Pain scale given on AVS)/10  Pre-op Assessment:  Mr. Swaim is a 52 y.o. (year old), male patient, seen today for interventional treatment. He  has a past surgical history that includes Back surgery; cosmetic eye surgery; Foot surgery; Nose surgery; Vasectomy; Esophagogastroduodenoscopy (egd) with propofol (N/A, 06/05/2017); and Colonoscopy with propofol (N/A, 06/05/2017). Mr. Pfeifle has a current medication list which includes the following prescription(s): acetaminophen-codeine, advair diskus, alprazolam, aspirin low dose, capsaicin, hydrochlorothiazide, nortriptyline, omeprazole, oxcarbazepine, proair hfa, topiramate, and vimpat, and the following Facility-Administered Medications: fentanyl and lactated ringers. His primarily  concern today is the Neck Pain (right)  Initial Vital Signs:  Pulse/HCG Rate: 62  Temp: 97.6 F (36.4 C) Resp: 16 BP: (!) 143/82 SpO2: 99 %  BMI: Estimated body mass index is 28.74 kg/m as calculated from the following:   Height as of this encounter: 5' 11.5" (1.816 m).   Weight as of this encounter: 209 lb (94.8 kg).  Risk Assessment: Allergies: Reviewed. He is allergic to dilantin [phenytoin] and sulfa antibiotics.  Allergy Precautions: None required Coagulopathies: Reviewed. None identified.  Blood-thinner therapy: None at this time Active Infection(s): Reviewed. None identified. Mr. Lipsitz is afebrile  Site Confirmation: Mr. Study was asked to confirm the procedure and laterality before marking the site Procedure checklist: Completed Consent: Before the procedure and under the influence of no sedative(s), amnesic(s), or anxiolytics, the patient was informed of the treatment options, risks and possible complications. To fulfill our ethical and legal obligations, as recommended by the American Medical Association's Code of Ethics, I have informed the patient of my clinical impression; the nature and purpose of the treatment or procedure; the risks, benefits, and possible complications of the intervention; the alternatives, including doing nothing; the risk(s) and benefit(s) of the alternative treatment(s) or procedure(s); and the risk(s) and benefit(s) of doing nothing. The patient was provided information about the general risks and possible complications associated with the procedure. These may include, but are not limited to: failure to achieve desired goals, infection, bleeding, organ or nerve damage, allergic reactions, paralysis, and death. In addition, the patient was informed of those risks and complications associated to Spine-related procedures, such as failure to decrease pain; infection (i.e.: Meningitis, epidural or intraspinal abscess); bleeding (i.e.: epidural hematoma,  subarachnoid hemorrhage, or any other type of intraspinal or peri-dural bleeding); organ or nerve damage (i.e.: Any type of peripheral nerve, nerve root, or spinal cord injury) with subsequent damage to sensory, motor, and/or autonomic  systems, resulting in permanent pain, numbness, and/or weakness of one or several areas of the body; allergic reactions; (i.e.: anaphylactic reaction); and/or death. Furthermore, the patient was informed of those risks and complications associated with the medications. These include, but are not limited to: allergic reactions (i.e.: anaphylactic or anaphylactoid reaction(s)); adrenal axis suppression; blood sugar elevation that in diabetics may result in ketoacidosis or comma; water retention that in patients with history of congestive heart failure may result in shortness of breath, pulmonary edema, and decompensation with resultant heart failure; weight gain; swelling or edema; medication-induced neural toxicity; particulate matter embolism and blood vessel occlusion with resultant organ, and/or nervous system infarction; and/or aseptic necrosis of one or more joints. Finally, the patient was informed that Medicine is not an exact science; therefore, there is also the possibility of unforeseen or unpredictable risks and/or possible complications that may result in a catastrophic outcome. The patient indicated having understood very clearly. We have given the patient no guarantees and we have made no promises. Enough time was given to the patient to ask questions, all of which were answered to the patient's satisfaction. Mr. Dollard has indicated that he wanted to continue with the procedure. Attestation: I, the ordering provider, attest that I have discussed with the patient the benefits, risks, side-effects, alternatives, likelihood of achieving goals, and potential problems during recovery for the procedure that I have provided informed consent. Date  Time: 11/09/2017  1:03  PM  Pre-Procedure Preparation:  Monitoring: As per clinic protocol. Respiration, ETCO2, SpO2, BP, heart rate and rhythm monitor placed and checked for adequate function Safety Precautions: Patient was assessed for positional comfort and pressure points before starting the procedure. Time-out: I initiated and conducted the "Time-out" before starting the procedure, as per protocol. The patient was asked to participate by confirming the accuracy of the "Time Out" information. Verification of the correct person, site, and procedure were performed and confirmed by me, the nursing staff, and the patient. "Time-out" conducted as per Joint Commission's Universal Protocol (UP.01.01.01). Time: 1406  Description of Procedure:          Target Area: For Epidural Steroid injections the target is the interlaminar space, initially targeting the lower border of the superior vertebral body lamina. Approach: Paramedial approach. Area Prepped: Entire PosteriorCervical Region Prepping solution: ChloraPrep (2% chlorhexidine gluconate and 70% isopropyl alcohol) Safety Precautions: Aspiration looking for blood return was conducted prior to all injections. At no point did we inject any substances, as a needle was being advanced. No attempts were made at seeking any paresthesias. Safe injection practices and needle disposal techniques used. Medications properly checked for expiration dates. SDV (single dose vial) medications used. Description of the Procedure: Protocol guidelines were followed. The procedure needle was introduced through the skin, ipsilateral to the reported pain, and advanced to the target area. Bone was contacted and the needle walked caudad, until the lamina was cleared. The epidural space was identified using "loss-of-resistance technique" with 2-3 ml of PF-NaCl (0.9% NSS), in a 5cc LOR glass syringe. Vitals:   11/09/17 1330 11/09/17 1405 11/09/17 1410 11/09/17 1415  BP: (!) 143/82 134/86 126/89 131/90   Pulse: 62 (!) 59 (!) 59 60  Resp: 16 16 17 16   Temp: 97.6 F (36.4 C)     SpO2: 99% 99% 99% 99%  Weight: 209 lb (94.8 kg)     Height: 5' 11.5" (1.816 m)       Start Time: 1406 hrs. End Time: 1413 hrs. Materials:  Needle(s) Type: Epidural  needle Gauge: 22G Length: 3.5-in Medication(s): Please see orders for medications and dosing details. 3 cc solution made of 1 cc of preservative-free saline, 1 cc of 0.2% ropivacaine, 1 cc of Decadron 10 mg/cc Imaging Guidance (Spinal):          Type of Imaging Technique: Fluoroscopy Guidance (Spinal) Indication(s): Assistance in needle guidance and placement for procedures requiring needle placement in or near specific anatomical locations not easily accessible without such assistance. Exposure Time: Please see nurses notes. Contrast: Before injecting any contrast, we confirmed that the patient did not have an allergy to iodine, shellfish, or radiological contrast. Once satisfactory needle placement was completed at the desired level, radiological contrast was injected. Contrast injected under live fluoroscopy. No contrast complications. See chart for type and volume of contrast used. Fluoroscopic Guidance: I was personally present during the use of fluoroscopy. "Tunnel Vision Technique" used to obtain the best possible view of the target area. Parallax error corrected before commencing the procedure. "Direction-depth-direction" technique used to introduce the needle under continuous pulsed fluoroscopy. Once target was reached, antero-posterior, oblique, and lateral fluoroscopic projection used confirm needle placement in all planes. Images permanently stored in EMR. Interpretation: I personally interpreted the imaging intraoperatively. Adequate needle placement confirmed in multiple planes. Appropriate spread of contrast into desired area was observed. No evidence of afferent or efferent intravascular uptake. No intrathecal or subarachnoid spread observed.  Permanent images saved into the patient's record.  Antibiotic Prophylaxis:   Anti-infectives (From admission, onward)   None     Indication(s): None identified  Post-operative Assessment:  Post-procedure Vital Signs:  Pulse/HCG Rate: 60  Temp: 97.6 F (36.4 C) Resp: 16 BP: 131/90 SpO2: 99 %  EBL: None  Complications: No immediate post-treatment complications observed by team, or reported by patient.  Note: The patient tolerated the entire procedure well. A repeat set of vitals were taken after the procedure and the patient was kept under observation following institutional policy, for this type of procedure. Post-procedural neurological assessment was performed, showing return to baseline, prior to discharge. The patient was provided with post-procedure discharge instructions, including a section on how to identify potential problems. Should any problems arise concerning this procedure, the patient was given instructions to immediately contact us, at any time, without hesitation. In any case, we plan to contact the patient by telephone for a follow-up status report regarding this interventional procedure.  Comments:  No additional relevant information.  5 out of 5 strength bilateral upper extremity: Shoulder abduction, elbow flexion, elbow extension, thumb extension.  Plan of Care    Imaging Orders     DG C-Arm 1-60 Min-No Report Procedure Orders    No procedure(s) ordered today    Medications ordered for procedure: Meds ordered this encounter  Medications  . lactated ringers infusion 1,000 mL  . fentaNYL (SUBLIMAZE) injection 25-100 mcg    Make sure Narcan is available in the pyxis when using this medication. In the event of respiratory depression (RR< 8/min): Titrate NARCAN (naloxone) in increments of 0.1 to 0.2 mg IV at 2-3 minute intervals, until desired degree of reversal.  . iopamidol (ISOVUE-M) 41 % intrathecal injection 10 mL  . dexamethasone (DECADRON) injection  10 mg  . ropivacaine (PF) 2 mg/mL (0.2%) (NAROPIN) injection 1 mL  . sodium chloride flush (NS) 0.9 % injection 1 mL  . lidocaine (XYLOCAINE) 2 % (with pres) injection 200 mg   Medications administered: We administered iopamidol, dexamethasone, ropivacaine (PF) 2 mg/mL (0.2%), sodium chloride flush, and lidocaine.  See the medical record for exact dosing, route, and time of administration.  Disposition: Discharge home  Discharge Date & Time: 11/09/2017; 1418 hrs.   Physician-requested Follow-up: Return in about 5 weeks (around 12/14/2017) for Post Procedure Evaluation.  Future Appointments  Date Time Provider [place]  12/14/2017  1:45 PM Gillis Santa, MD [facility] None   Primary Care Physician: [facility] Location: [facility] Note by: Gillis Santa, MD Date: 11/09/2017; Time: 2:28 PM  Disclaimer:  Medicine is not an exact science. The only guarantee in medicine is that nothing is guaranteed. It is important to note that the decision to proceed with this intervention was based on the information collected from the patient. The Data and conclusions were drawn from the patient's questionnaire, the interview, and the physical examination. Because the information was provided in large part by the patient, it cannot be guaranteed that it has not been purposely or unconsciously manipulated. Every effort has been made to obtain as much relevant data as possible for this evaluation. It is important to note that the conclusions that lead to this procedure are derived in large part from the available data. Always take into account that the treatment will also be dependent on availability of resources and existing treatment guidelines, considered by other Pain Management Practitioners as being common knowledge and practice, at the time of the intervention. For Medico-Legal purposes, it is also important to point out that variation in procedural  techniques and pharmacological choices are the acceptable norm. The indications, contraindications, technique, and results of the above procedure should only be interpreted and judged by a Board-Certified Interventional Pain Specialist with extensive familiarity and expertise in the same exact procedure and technique.

## 2017-11-09 NOTE — Patient Instructions (Signed)
______________________________________________________________________________________________  Specialist's Pain Scale  Introduction:  The pain scale used by our Pain Specialists is different from that used by non-specialist.  It has 11 levels with "0" being no pain.  As you will learn, levels "6" through "10" do not belong in an outpatient pain facility. Learn and use this scale.  General Information:  The pain score should reflect your current level of pain at the time you are being asked. (Amount of pain you have NOW). Unless asked about your worst pain, or average pain, we will always be referring to your current pain.  Definition:  ADL: (Activities of Daily Living) This refers to basic activities such as bathing, brushing your teeth, getting dressed, eating, getting out of bed or a chair, and using restroom.  Instructions: Always describe your pain, based on what it allows you to do. Read below.     Score Level Description  0 No Pain Pain Description: No pain.    Effects on ADLs: None    Physiologic Effects: None    Treatment Location: Outpatient Pain Facility      1 Mild Pain Description: Nagging, annoying    Effects on ADLs: Does not interfere with ability to eat, bathe, get dressed, use the toilet without assistance, move in and out of bed or chair, or control your bowel and/or bladder. Ability to do house work and to maintain gainful employment is retained.    Physiologic Effects: Blood pressure and heart rate are seldom affected.    Treatment Location: Outpatient Pain Facility      2 Mild to Pain Description: Nagging, annoying, + noticeable and distracting.   Moderate ADL Effects: Frequent flare-ups. Possible to adapt and function. Still able to eat, bathe, get dressed, use the toilet, transfer in or out of bed/chair without assistance. Ability to do house work and to maintain gainful employment is still possible.    Physiologic Effects: Blood pressure and heart rate may be  affected with flare-ups. Bowel and/or bladder control is still unaffected.     Treatment Location: Outpatient Pain Facility      3 Moderate Pain Description: Nagging, annoying, noticeable, + very distracting & difficult to ignore.    ADL Effects: ADL such as bathing, getting dressed, cooking, getting out of bed and getting up from a chair takes additional effort. Ability to do house work and to maintain gainful employment may be diminished.    Physiologic Effects: Baseline blood pressure and heart rate may become elevated.     Treatment Location: Outpatient Pain Facility      4 Moderate to Severe Pain Description: Nagging, annoying, noticeable, very distracting, + impossible to ignore and very difficult to concentrate.    ADL Effects: With effort, patients may still be able to manage work or participate in some social activities. Patients find relief in laying down and not moving.    Physiologic Effects: Signs of autonomic nervous system discharge are evident: dilated pupils (mydriasis); mild sweating (diaphoresis); sleep interference. Heart rate becomes elevated (>115 bpm). Diastolic blood pressure (lower number) rises above 100 mmHg.    Treatment Location: Outpatient Pain Facility      5 Severe Pain Description: Nagging, annoying, noticeable, very distracting, impossible to ignore and very difficult to concentrate. + Pain is intense and extremely unpleasant.    ADL Effects: Associated with frowning face and frequent crying. Pain overwhelms the senses.  Ability to do any activity or maintain social relationships becomes significantly limited. Conversation becomes difficult. Pacing back and forth is common,  as getting into a comfortable position is nearly impossible. Pain wakes you up from deep sleep.     Physiologic Effects: Physical signs will be obvious: pupillary dilation; increased sweating; goosebumps; brisk reflexes; cold, clammy hands and feet; nausea, vomiting or dry heaves; loss of appetite;  significant sleep disturbance with inability to fall asleep or to remain asleep. When persistent, significant weight loss is observed due to the complete loss of appetite and sleep deprivation.  Blood pressure and heart rate becomes significantly elevated. Caution: If elevated blood pressure triggers a pounding headache associated with blurred vision, then the patient should immediately seek attention at an urgent or emergency care unit, as these may be signs of an impending stroke.    Treatment Location: Outpatient Pain Facility      6 Distressing Pain Description: Severe    ADL Effects: Extremely limiting. Communication becomes difficult and requires great effort. Assistance to reach the emergency department may be required.     Physiologic Effects: Facial flushing and profuse sweating along with potentially dangerous increases in heart rate and blood pressure will be evident.    Treatment Location: Inpatient Emergency Department. Requires emergency care and should not be seen or managed at an outpatient pain management facility.     NOTE: This is an emergency department pain level. This level may not be treated in an outpatient pain facility. Patients will be transferred to emergency department for management. Patients declaring this level of pain, while not meeting the above description, will be labeled as "symptom exaggeration".      7 Disabling Pain Description: Extremely Severe    ADL Effects: Self-care is very difficult. Assistance is required to transport, or use restroom. Assistance to reach the emergency department will be required. Tasks requiring coordination, such as bathing and getting dressed become very difficult.    Physiologic Effects:     Treatment Location: Inpatient Emergency Department. Requires emergency care and should not be seen or managed at an outpatient pain management facility.     NOTE: This is an emergency department pain level. This level may not be treated in an  outpatient pain facility. Patients will be transferred to emergency department for management. Patients declaring this level of pain, while not meeting the above description, will be labeled as "symptom exaggeration".      8 Incapacitating Pain Description: Excruciating    ADL Effects: Self-care is no longer possible. At this level, pain is disabling. The individual is unable to do even the most "basic" activities such as walking, eating, bathing, dressing, transferring to a bed, or toileting. clearly.    Physiologic Effects: Fine motor skills are lost. It is difficult to think.    Treatment Location: Inpatient Emergency Department. Requires emergency care and should not be seen or managed at an outpatient pain management facility.     NOTE: This is an emergency department pain level. This level may not be treated in an outpatient pain facility. Patients will be transferred to emergency department for management. Patients declaring this level of pain, while not meeting the above description, will be labeled as "symptom exaggeration".      9 Worst pain Pain Description: Intolerable   imaginable ADL Effects: Pain becomes incapacitating. Self-care is not possible. Pain is disabling. The individual is unable to do any of the "basic" activities (walking, eating, bathing, dressing, transferring to a bed, or toileting).    Physiologic Effects: Thought processing is no longer possible. Difficult to remember your own name. Control of movement and coordination  are lost.    Treatment Location: Inpatient Emergency Department. Requires emergency care and should not be seen or managed at an outpatient pain management facility.     NOTE: This is an emergency department pain level. This level may not be treated in an outpatient pain facility. Patients will be transferred to emergency department for management. Patients declaring this level of pain, while not meeting the above description, will be labeled as "symptom  exaggeration".      10 Patients will Pain Description: At this level, most patients pass out from pain.    pass out ADL Effects: Patient is unconscious. No activity is possible. Medical emergency treatment required.    Physiologic Effects: When this level is reached, collapse of the autonomic nervous system occurs, leading to a sudden drop in blood pressure and heart rate. This in turn results in a temporary and dramatic drop in blood flow to the brain, leading to a loss of consciousness. Fainting is one of the body's self defense mechanisms. Passing out puts the brain in a calmed state and causes it to shut down for a while, in order to begin the healing process.    Treatment Location: EMS (Emergency Medical Services) are required. Requires emergency care and should not be seen or managed at an outpatient pain management facility.     NOTE: This is an emergency department pain level. This level may not be treated in an outpatient pain facility. Patients will be transferred to emergency department for management. Patients declaring this level of pain, while not meeting the above description, will be labeled as "symptom exaggeration".        Emergency Department Pain Levels (6-10/10)  Emergency Room Pain 6 Communication becomes difficult and requires great effort. Assistance to reach the emergency department may be required. Facial flushing and profuse sweating along with potentially dangerous increases in heart rate and blood pressure will be evident.   Distressing 7 Self-care is very difficult. Assistance is required to transport, or use restroom. Assistance to reach the emergency department will be required. Tasks requiring coordination, such as bathing and getting dressed become very difficult.   Disabling 8 Self-care is no longer possible. At this level, pain is disabling. The individual is unable to do even the most "basic" activities such as walking, eating, bathing, dressing, transferring to a  bed, or toileting. Fine motor skills are lost. It is difficult to think clearly.   Incapacitating 9 Pain becomes incapacitating. Thought processing is no longer possible. Difficult to remember your own name. Control of movement and coordination are lost.    Summary: 1. Refer to this scale when providing [place] with your pain level. 2. Be accurate and careful when reporting your pain level. This will help with your care. 3. Over-reporting your pain level will lead to loss of credibility. 4. Even a level of 1/10 means that there is pain and will be treated at our facility. 5. High, inaccurate reporting will be documented as "Symptom Exaggeration", leading to loss of credibility and suspicions of possible secondary gains such as obtaining more narcotics, or wanting to appear disabled, for fraudulent reasons. 6. Only pain levels of 5 or below will be seen at our facility. 7. Pain levels of 6 and above will be sent to the Emergency Department and the appointment cancelled. ____________________________________________________________________________________________  ____________________________________________________________________________________________  Post-Procedure Information  What to expect: Most procedures involve the use of a local anesthetic (numbing medicine), and a steroid (anti-inflammatory medicine).  The local anesthetics may cause temporary  numbness and weakness of the legs or arms, depending on the location of the block. This numbness/weakness may last 4-6 hours, depending on the local anesthetic used. In rare instances, it can last up to 24 hours. While numb, you must be very careful not to injure the extremity.  After any procedure, you could expect the pain to get better within 15-20 minutes. This relief is temporary and may last 4-6 hours. Once the local anesthetics wears off, you could experience discomfort, possibly more than usual, for up to 10 (ten) days. In the case of  radiofrequencies, it may last up to 6 weeks. Surgeries may take up to 8 weeks for the healing process. The discomfort is due to the irritation caused by needles going through skin and muscle. To minimize the discomfort, we recommend using ice the first day, and heat from then on. The ice should be applied for 15 minutes on, and 15 minutes off. Keep repeating this cycle until bedtime. Avoid applying the ice directly to the skin, to prevent frostbite. Heat should be used daily, until the pain improves (4-10 days). Be careful not to burn yourself.  Occasionally you may experience muscle spasms or cramps. These occur as a consequence of the irritation caused by the needle sticks to the muscle and the blood that will inevitably be lost into the surrounding muscle tissue. Blood tends to be very irritating to tissues, which tend to react by going into spasm. These spasms may start the same day of your procedure, but they may also take days to develop. This late onset type of spasm or cramp is usually caused by electrolyte imbalances triggered by the steroids, at the level of the kidney. Cramps and spasms tend to respond well to muscle relaxants, multivitamins (some are triggered by the procedure, but may have their origins in vitamin deficiencies), and "Gatorade", or any sports drinks that can replenish any electrolyte imbalances. (If you are a diabetic, ask your pharmacist to get you a sugar-free brand.) Warm showers or baths may also be helpful. Stretching exercises are highly recommended.  General Instructions:  Be alert for signs of possible infection: redness, swelling, heat, red streaks, elevated temperature, and/or fever. These typically appear 4 to 6 days after the procedure. Immediately notify your doctor if you experience unusual bleeding, difficulty breathing, or loss of bowel or bladder control. If you experience increased pain, do not increase your pain medicine intake, unless instructed by your pain  physician.  Post-Procedure Care:  Be careful in moving about. Muscle spasms in the area of the injection may occur. Applying ice or heat to the area is often helpful. The incidence of spinal headaches after epidural injections ranges between 1.4% and 6%. If you develop a headache that does not seem to respond to conservative therapy, please let your physician know. This can be treated with an epidural blood patch.   Post-procedure numbness or redness is to be expected, however it should average 4 to 6 hours. If numbness and weakness of your extremities begins to develop 4 to 6 hours after your procedure, and is felt to be progressing and worsening, immediately contact your physician.    Diet:  If you experience nausea, do not eat until this sensation goes away. If you had a "Stellate Ganglion Block" for upper extremity "Reflex Sympathetic Dystrophy", do not eat or drink until your hoarseness goes away. In any case, always start with liquids first and if you tolerate them well, then slowly progress to more solid foods.  Activity:  For the first 4 to 6 hours after the procedure, use caution in moving about as you may experience numbness and/or weakness. Use caution in cooking, using household electrical appliances, and climbing steps.  If you need to reach your Doctor call our office: [phone] Monday-Thursday 8:00 am - 4:00 PM    Fridays: Closed      In case of an emergency: In case of emergency, call 911 or go to the nearest emergency room and have the physician there call us.  Interpretation of Procedure Every nerve block has two components: a diagnostic component, and a treatment component. Unrealistic expectations are the most common causes of "perceived failure".  In a perfect world, a single nerve block should be able to completely and permanently eliminate the pain. Sadly, the world is not perfect.  Most pain management nerve blocks are performed using local anesthetics and  steroids. Steroids are responsible for any long-term benefit that you may experience. Their purpose is to decrease any chronic swelling that may exist in the area. Steroids begin to work immediately after being injected. However, most patients will not experience any benefits until 5 to 10 days after the injection, when the swelling has come down to the point where they can tell a difference. Steroids will only help if there is swelling to be treated. As such, they can assist with the diagnosis. If effective, they suggest an inflammatory component to the pain, and if ineffective, they rule out inflammation as the main cause or component of the problem. If the problem is one of mechanical compression, you will get no benefit from those steroids.   In the case of local anesthetics, they have a crucial role in the diagnosis of your condition. Most will begin to work within15 to 20 minutes after injection. The duration will depend on the type used (short- vs. Long-acting). It is of outmost importance that patients keep tract of their pain, after the procedure. To assist with this matter, a "Post-procedure Pain Diary" is provided. Make sure to complete it and to bring it back to your follow-up appointment.  As long as the patient keeps accurate, detailed records of their symptoms after every procedure, and returns to have those interpreted, every procedure will provide [place] with invaluable information. Even a block that does not provide the patient with any relief, will always provide [place] with information about the mechanism and the origin of the pain. The only time a nerve block can be considered a waste of time is when patients do not keep track of the results, or do not keep their post-procedure appointment.  Reporting the results back to your physician The Pain Score  Pain is a subjective complaint. It cannot be seen, touched, or measured. We depend entirely on the patient's report of the pain in order to assess  your condition and treatment. To evaluate the pain, we use a pain scale, where "0" means "No Pain", and a "10" is "the worst possible pain that you can even imagine" (i.e. something like been eaten alive by a shark or being torn apart by a lion).   You will frequently be asked to rate your pain. Please be as accurate, remember that medical decisions will be based on your responses. Please do not rate your pain above a 10. Doing so is actually interpreted as "symptom magnification" (exaggeration), as well as lack of understanding with regards to the scale. To put this into perspective, when you tell us that your pain  is at a 10 (ten), what you are saying is that there is nothing we can do to make this pain any worse. (Carefully think about that.)  Call if:  You experience numbness and weakness that gets worse with time, as opposed to wearing off.  New onset bowel or bladder incontinence. (This applies to Spinal procedures only)  Emergency Numbers:  [place] business hours (Monday - Thursday, 8:00 AM - 4:00 PM) (Friday, 9:00 AM - 12:00 Noon): [phone]  After hours: [phone] ____________________________________________________________________________________________

## 2017-11-10 ENCOUNTER — Telehealth: Payer: Self-pay

## 2017-11-10 NOTE — Telephone Encounter (Signed)
Post procedure phone call. Patient states he is doing well.  

## 2017-11-11 ENCOUNTER — Telehealth: Payer: Self-pay

## 2017-11-11 NOTE — Telephone Encounter (Signed)
Pt LVM to inform us that he is having R- arm pain after epidural , please call him back after [phone].

## 2017-11-11 NOTE — Telephone Encounter (Signed)
Called patient back and his symptoms have not changed since pre procedure on Monday when he had a cervical epidural. Denies fever, weakness, HA. Just states that right arm pain has not gone away and seems to be worse. Instructed him of how the block works and to give it a few more days. He may call for any concerns.

## 2017-11-11 NOTE — Telephone Encounter (Signed)
Patient called.

## 2017-11-11 NOTE — Telephone Encounter (Signed)
Pt called again and said that he is still in a lot of pain.

## 2017-12-14 ENCOUNTER — Encounter: Payer: Self-pay | Admitting: Student in an Organized Health Care Education/Training Program

## 2017-12-14 ENCOUNTER — Ambulatory Visit
Payer: Medicaid Other | Attending: Student in an Organized Health Care Education/Training Program | Admitting: Student in an Organized Health Care Education/Training Program

## 2017-12-14 VITALS — BP 128/95 | HR 76 | Temp 98.6°F | Resp 16 | Ht 71.0 in | Wt 207.0 lb

## 2017-12-14 DIAGNOSIS — M5136 Other intervertebral disc degeneration, lumbar region: Secondary | ICD-10-CM | POA: Insufficient documentation

## 2017-12-14 DIAGNOSIS — M5441 Lumbago with sciatica, right side: Secondary | ICD-10-CM | POA: Insufficient documentation

## 2017-12-14 DIAGNOSIS — M4722 Other spondylosis with radiculopathy, cervical region: Secondary | ICD-10-CM | POA: Insufficient documentation

## 2017-12-14 DIAGNOSIS — J45909 Unspecified asthma, uncomplicated: Secondary | ICD-10-CM | POA: Insufficient documentation

## 2017-12-14 DIAGNOSIS — F17210 Nicotine dependence, cigarettes, uncomplicated: Secondary | ICD-10-CM | POA: Diagnosis not present

## 2017-12-14 DIAGNOSIS — I10 Essential (primary) hypertension: Secondary | ICD-10-CM | POA: Insufficient documentation

## 2017-12-14 DIAGNOSIS — G894 Chronic pain syndrome: Secondary | ICD-10-CM

## 2017-12-14 DIAGNOSIS — Z79899 Other long term (current) drug therapy: Secondary | ICD-10-CM | POA: Diagnosis not present

## 2017-12-14 DIAGNOSIS — M5442 Lumbago with sciatica, left side: Secondary | ICD-10-CM | POA: Insufficient documentation

## 2017-12-14 DIAGNOSIS — Z882 Allergy status to sulfonamides status: Secondary | ICD-10-CM | POA: Diagnosis not present

## 2017-12-14 DIAGNOSIS — M4802 Spinal stenosis, cervical region: Secondary | ICD-10-CM | POA: Diagnosis not present

## 2017-12-14 DIAGNOSIS — M47812 Spondylosis without myelopathy or radiculopathy, cervical region: Secondary | ICD-10-CM

## 2017-12-14 DIAGNOSIS — M542 Cervicalgia: Secondary | ICD-10-CM | POA: Diagnosis present

## 2017-12-14 NOTE — Progress Notes (Signed)
Patient's Name: Mathew Robertson  MRN: [id number]  Referring Provider: [facility]  DOB: 1965-03-03  PCP: [facility]  DOS: 12/14/2017  Note by: Gillis Santa, MD  Service setting: Ambulatory outpatient  Specialty: Interventional Pain Management  Location: [facility] (AMB) Pain Management Facility    Patient type: Established   Primary Reason(s) for Visit: Encounter for post-procedure evaluation of chronic illness with mild to moderate exacerbation CC: Neck Pain (bilateral )  HPI  [name] is a 52 y.o. year old, male patient, who comes today for a post-procedure evaluation. He has Chronic bilateral low back pain with bilateral sciatica; Lumbar degenerative disc disease; Chronic pain syndrome; Cervicalgia; Right cervical radiculopathy; Foraminal stenosis of cervical region (R); and Osteoarthritis of spine with radiculopathy, cervical region on their problem list. His primarily concern today is the Neck Pain (bilateral )  Pain Assessment: Location: Left, Right Neck Radiating: denies Onset: More than a month ago Duration: Chronic pain Quality: Aching, Sharp, Contraction, Dull, Discomfort Severity: 10-Worst pain ever/10 (subjective, self-reported pain score)  Note: Reported level is inconsistent with clinical observations. Clinically the patient looks like a 3/10 A 3/10 is viewed as "Moderate" and described as significantly interfering with activities of daily living (ADL). It becomes difficult to feed, bathe, get dressed, get on and off the toilet or to perform personal hygiene functions. Difficult to get in and out of bed or a chair without assistance. Very distracting. With effort, it can be ignored when deeply involved in activities. Information on the proper use of the pain scale provided to the patient today. When using our objective Pain Scale, levels between 6 and 10/10 are said to belong in an emergency room, as it progressively worsens from a 6/10, described as  severely limiting, requiring emergency care not usually available at an outpatient pain management facility. At a 6/10 level, communication becomes difficult and requires great effort. Assistance to reach the emergency department may be required. Facial flushing and profuse sweating along with potentially dangerous increases in heart rate and blood pressure will be evident. Effect on ADL: limited ROM, unable to pick up anything heavy.  weakness in the left arm  Timing: Constant Modifying factors: nothing currently BP: (!) 128/95  HR: 76  [name] comes in today for post-procedure evaluation.  Further details on both, my assessment(s), as well as the proposed treatment plan, please see below.  Post-Procedure Assessment  11/09/2017 Procedure: Right C7-T1 ESI Pre-procedure pain score:  7/10 Post-procedure pain score: 5/10         Influential Factors: BMI: 28.87 kg/m Intra-procedural challenges: None observed.         Assessment challenges: None detected.              Reported side-effects: None.        Post-procedural adverse reactions or complications: None reported         Sedation: Please see nurses note. When no sedatives are used, the analgesic levels obtained are directly associated to the effectiveness of the local anesthetics. However, when sedation is provided, the level of analgesia obtained during the initial 1 hour following the intervention, is believed to be the result of a combination of factors. These factors may include, but are not limited to: 1. The effectiveness of the local anesthetics used. 2. The effects of the analgesic(s) and/or anxiolytic(s) used. 3. The degree of discomfort experienced by the patient at the time of the procedure. 4. The patients ability and reliability in recalling and recording the events.  5. The presence and influence of possible secondary gains and/or psychosocial factors. Reported result: Relief experienced during the 1st hour after the  procedure: 100 % (Ultra-Short Term Relief)            Interpretative annotation: Clinically appropriate result. Analgesia during this period is likely to be Local Anesthetic and/or IV Sedative (Analgesic/Anxiolytic) related.          Effects of local anesthetic: The analgesic effects attained during this period are directly associated to the localized infiltration of local anesthetics and therefore cary significant diagnostic value as to the etiological location, or anatomical origin, of the pain. Expected duration of relief is directly dependent on the pharmacodynamics of the local anesthetic used. Long-acting (4-6 hours) anesthetics used.  Reported result: Relief during the next 4 to 6 hour after the procedure: 100 % (Short-Term Relief)            Interpretative annotation: Clinically appropriate result. Analgesia during this period is likely to be Local Anesthetic-related.          Long-term benefit: Defined as the period of time past the expected duration of local anesthetics (1 hour for short-acting and 4-6 hours for long-acting). With the possible exception of prolonged sympathetic blockade from the local anesthetics, benefits during this period are typically attributed to, or associated with, other factors such as analgesic sensory neuropraxia, antiinflammatory effects, or beneficial biochemical changes provided by agents other than the local anesthetics.  Reported result: Extended relief following procedure: 100 %(pain relief last x 2 days, then over the next 2 weeks the pain started to return,. ) (Long-Term Relief)            Interpretative annotation: Clinically possible results. Good relief. No permanent benefit expected. Inflammation plays a part in the etiology to the pain.          Current benefits: Defined as reported results that persistent at this point in time.   Analgesia: 50 %            Function: Somewhat improved ROM: Somewhat improved Interpretative annotation: Ongoing benefit.  No permanent benefit expected. Effective diagnostic intervention.          Interpretation: Results would suggest a successful diagnostic intervention.                  Plan:  Please see "Plan of Care" for details.                Laboratory Chemistry  Inflammation Markers (CRP: Acute Phase) (ESR: Chronic Phase) Lab Results  Component Value Date   CRP 3.7 12/22/2016   ESRSEDRATE 12 12/22/2016                         Rheumatology Markers No results found for: RF, ANA, LABURIC, URICUR, LYMEIGGIGMAB, LYMEABIGMQN, HLAB27                      Renal Function Markers Lab Results  Component Value Date   BUN 22 12/22/2016   CREATININE 1.12 12/22/2016   BCR 20 12/22/2016   GFRAA 87 12/22/2016   GFRNONAA 76 12/22/2016                             Hepatic Function Markers Lab Results  Component Value Date   AST 16 12/22/2016   ALT 12 12/22/2016   ALBUMIN 4.8 12/22/2016   ALKPHOS 115 12/22/2016  Electrolytes Lab Results  Component Value Date   NA 146 (H) 12/22/2016   K 3.8 12/22/2016   CL 104 12/22/2016   CALCIUM 9.1 12/22/2016   MG 2.2 12/22/2016                        Neuropathy Markers Lab Results  Component Value Date   VITAMINB12 280 12/22/2016                        CNS Tests No results found for: COLORCSF, APPEARCSF, RBCCOUNTCSF, WBCCSF, POLYSCSF, LYMPHSCSF, EOSCSF, PROTEINCSF, GLUCCSF, JCVIRUS, CSFOLI, IGGCSF                      Bone Pathology Markers Lab Results  Component Value Date   25OHVITD1 25 (L) 12/22/2016   25OHVITD2 <1.0 12/22/2016   25OHVITD3 25 12/22/2016                         Coagulation Parameters No results found for: INR, LABPROT, APTT, PLT, DDIMER, LABHEMA, VITAMINK1                      Cardiovascular Markers No results found for: BNP, CKTOTAL, CKMB, TROPONINI, HGB, HCT                       CA Markers No results found for: CEA, CA125, LABCA2                      Note: Lab results reviewed.  Recent  Diagnostic Imaging Results  DG C-Arm 1-60 Min-No Report Fluoroscopy was utilized by the requesting physician.  No radiographic  interpretation.   Complexity Note: Imaging results reviewed. Results shared with Mr. Blumenfeld, using Layman's terms.                         Meds   Current Outpatient Medications:  .  ADVAIR DISKUS 250-50 MCG/DOSE AEPB, Take 1 puff by mouth as needed., Disp: , Rfl: 0 .  ASPIRIN LOW DOSE 81 MG EC tablet, Take 1 tablet by mouth daily., Disp: , Rfl: 3 .  capsaicin (ZOSTRIX) 0.025 % cream, Apply topically 2 (two) times daily., Disp: , Rfl:  .  clonazePAM (KLONOPIN) 1 MG tablet, Take 1 mg by mouth at bedtime., Disp: , Rfl: 0 .  escitalopram (LEXAPRO) 5 MG tablet, Take 5 mg by mouth at bedtime., Disp: , Rfl: 0 .  gabapentin (NEURONTIN) 600 MG tablet, Take 600 mg by mouth 4 (four) times daily., Disp: , Rfl: 1 .  hydrochlorothiazide (HYDRODIURIL) 25 MG tablet, Take 1 tablet by mouth daily., Disp: , Rfl: 2 .  nortriptyline (PAMELOR) 25 MG capsule, Take 2 capsules by mouth at bedtime as needed., Disp: , Rfl: 0 .  omeprazole (PRILOSEC) 40 MG capsule, TK ONE C PO QD, Disp: , Rfl: 0 .  Oxcarbazepine (TRILEPTAL) 300 MG tablet, Take 300 mg by mouth 2 (two) times daily., Disp: , Rfl:  .  PROAIR HFA 108 (90 Base) MCG/ACT inhaler, Take 2 puffs by mouth 4 (four) times daily as needed., Disp: , Rfl: 1 .  topiramate (TOPAMAX) 50 MG tablet, Take 100 mg by mouth 2 (two) times daily. 1 tablet in the am and 2 tablets nightly., Disp: , Rfl: 0 .  VIMPAT 200 MG TABS tablet, Take 1 tablet by mouth  2 (two) times daily., Disp: , Rfl: 0 .  acetaminophen-codeine (TYLENOL #3) 300-30 MG tablet, Take 1 tablet by mouth every 8 (eight) hours as needed., Disp: , Rfl: 0 .  ALPRAZolam (XANAX) 1 MG tablet, Take 1 tablet by mouth 3 (three) times daily., Disp: , Rfl: 0  ROS  Constitutional: Denies any fever or chills Gastrointestinal: No reported hemesis, hematochezia, vomiting, or acute GI  distress Musculoskeletal: Denies any acute onset joint swelling, redness, loss of ROM, or weakness Neurological: No reported episodes of acute onset apraxia, aphasia, dysarthria, agnosia, amnesia, paralysis, loss of coordination, or loss of consciousness  Allergies  Mr. Mustin is allergic to dilantin [phenytoin] and sulfa antibiotics.  [place]  Drug: Mr. Saran  reports that he does not use drugs. Alcohol:  reports that he drank alcohol. Tobacco:  reports that he has been smoking cigarettes. He has a 5.00 pack-year smoking history. He has never used smokeless tobacco. Medical:  has a past medical history of Asthma, Chronic pain disorder, Headache, Hypertension, and Seizures ([place]). Surgical: Mr. Somerville  has a past surgical history that includes Back surgery; cosmetic eye surgery; Foot surgery; Nose surgery; Vasectomy; Esophagogastroduodenoscopy (egd) with propofol (N/A, 06/05/2017); and Colonoscopy with propofol (N/A, 06/05/2017). Family: family history includes Arthritis in his mother; Cancer in his mother; Mental illness in his mother.  Constitutional Exam  General appearance: Well nourished, well developed, and well hydrated. In no apparent acute distress Vitals:   12/14/17 1320  BP: (!) 128/95  Pulse: 76  Resp: 16  Temp: 98.6 F (37 C)  TempSrc: Oral  SpO2: 98%  Weight: 207 lb (93.9 kg)  Height: _0  (1.803 m)   BMI Assessment: Estimated body mass index is 28.87 kg/m as calculated from the following:   Height as of this encounter: _1  (1.803 m).   Weight as of this encounter: 207 lb (93.9 kg).  BMI interpretation table: BMI level Category Range association with higher incidence of chronic pain  <18 kg/m2 Underweight   18.5-24.9 kg/m2 Ideal body weight   25-29.9 kg/m2 Overweight Increased incidence by 20%  30-34.9 kg/m2 Obese (Class I) Increased incidence by 68%  35-39.9 kg/m2 Severe obesity (Class II) Increased incidence by 136%  >40 kg/m2 Extreme obesity (Class III)  Increased incidence by 254%   Patient's current BMI Ideal Body weight  Body mass index is 28.87 kg/m. Ideal body weight: 75.3 kg (166 lb 0.1 oz) Adjusted ideal body weight: 82.7 kg (182 lb 6.5 oz)   BMI Readings from Last 4 Encounters:  12/14/17 28.87 kg/m  11/09/17 28.74 kg/m  09/17/17 29.57 kg/m  08/25/17 28.73 kg/m   Wt Readings from Last 4 Encounters:  12/14/17 207 lb (93.9 kg)  11/09/17 209 lb (94.8 kg)  09/17/17 212 lb (96.2 kg)  08/25/17 206 lb (93.4 kg)  Psych/Mental status: Alert, oriented x 3 (person, place, & time)       Eyes: PERLA Respiratory: No evidence of acute respiratory distress  Cervical Spine Area Exam  Skin & Axial Inspection: No masses, redness, edema, swelling, or associated skin lesions Alignment: Symmetrical Functional ROM: Decreased ROM, bilaterally Stability: No instability detected Muscle Tone/Strength: Functionally intact. No obvious neuro-muscular anomalies detected. Sensory (Neurological): Arthropathic arthralgia on the left Palpation: Complains of area being tender to palpation Positive provocative maneuver for for cervical facet disease, left greater than right  Upper Extremity (UE) Exam    Side: Right upper extremity  Side: Left upper extremity  Skin & Extremity Inspection: Skin color, temperature, and hair growth  are WNL. No peripheral edema or cyanosis. No masses, redness, swelling, asymmetry, or associated skin lesions. No contractures.  Skin & Extremity Inspection: Skin color, temperature, and hair growth are WNL. No peripheral edema or cyanosis. No masses, redness, swelling, asymmetry, or associated skin lesions. No contractures.  Functional ROM: Decreased ROM for shoulder and elbow  Functional ROM: Unrestricted ROM          Muscle Tone/Strength: Functionally intact. No obvious neuro-muscular anomalies detected.  Muscle Tone/Strength: Functionally intact. No obvious neuro-muscular anomalies detected.  Sensory (Neurological): Dermatomal  pain pattern improved after treatment          Sensory (Neurological): Unimpaired          Palpation: No palpable anomalies              Palpation: No palpable anomalies              Provocative Test(s):  Phalen's test: deferred Tinel's test: deferred Apley's scratch test (touch opposite shoulder):  Action 1 (Across chest): Decreased ROM Action 2 (Overhead): Decreased ROM Action 3 (LB reach): Decreased ROM   Provocative Test(s):  Phalen's test: deferred Tinel's test: deferred Apley's scratch test (touch opposite shoulder):  Action 1 (Across chest): deferred Action 2 (Overhead): deferred Action 3 (LB reach): deferred    Thoracic Spine Area Exam  Skin & Axial Inspection: No masses, redness, or swelling Alignment: Symmetrical Functional ROM: Unrestricted ROM Stability: No instability detected Muscle Tone/Strength: Functionally intact. No obvious neuro-muscular anomalies detected. Sensory (Neurological): Unimpaired Muscle strength & Tone: No palpable anomalies  Lumbar Spine Area Exam  Skin & Axial Inspection: No masses, redness, or swelling Alignment: Symmetrical Functional ROM: Unrestricted ROM       Stability: No instability detected Muscle Tone/Strength: Functionally intact. No obvious neuro-muscular anomalies detected. Sensory (Neurological): Unimpaired Palpation: No palpable anomalies       Provocative Tests: Hyperextension/rotation test: deferred today       Lumbar quadrant test (Kemp's test): deferred today       Lateral bending test: deferred today       Patrick's Maneuver: deferred today                   FABER* test: deferred today                   S-I anterior distraction/compression test: deferred today         S-I lateral compression test: deferred today         S-I Thigh-thrust test: deferred today         S-I Gaenslen's test: deferred today         *(Flexion, ABduction and External Rotation)  Gait & Posture Assessment  Ambulation: Unassisted Gait:  Relatively normal for age and body habitus Posture: WNL   Lower Extremity Exam    Side: Right lower extremity  Side: Left lower extremity  Stability: No instability observed          Stability: No instability observed          Skin & Extremity Inspection: Skin color, temperature, and hair growth are WNL. No peripheral edema or cyanosis. No masses, redness, swelling, asymmetry, or associated skin lesions. No contractures.  Skin & Extremity Inspection: Skin color, temperature, and hair growth are WNL. No peripheral edema or cyanosis. No masses, redness, swelling, asymmetry, or associated skin lesions. No contractures.  Functional ROM: Unrestricted ROM                  Functional  ROM: Unrestricted ROM                  Muscle Tone/Strength: Functionally intact. No obvious neuro-muscular anomalies detected.  Muscle Tone/Strength: Functionally intact. No obvious neuro-muscular anomalies detected.  Sensory (Neurological): Unimpaired        Sensory (Neurological): Unimpaired        DTR: Patellar: deferred today Achilles: deferred today Plantar: deferred today  DTR: Patellar: deferred today Achilles: deferred today Plantar: deferred today  Palpation: No palpable anomalies  Palpation: No palpable anomalies   Assessment  Primary Diagnosis & Pertinent Problem List: The primary encounter diagnosis was Cervical facet syndrome. Diagnoses of Cervical spondylosis and Chronic pain syndrome were also pertinent to this visit.  Status Diagnosis  Having a Flare-up Persistent Persistent 1. Cervical facet syndrome   2. Cervical spondylosis   3. Chronic pain syndrome      52 year old male with a history of right cervical radiculopathy, cervical spondylosis who follows up status post right C7-T1 epidural steroid injection.  Patient states that his right-sided symptoms including his neck pain and his arm pain have improved after the cervical epidural steroid injection.  He states that he wants to hold off on  being referred to a surgeon at the moment.  He is now endorsing left-sided neck pain that radiates down to his trapezius and periscapular region.    Larin Weissberg has a history of greater than 3 months of moderate to severe pain which is resulted in functional impairment.  The patient has tried various conservative therapeutic options such as NSAIDs, Tylenol, muscle relaxants, physical therapy which was inadequately effective.  Patient's pain is predominantly axial with physical exam findings suggestive of facet arthropathy.  Cervical facet medial branch nerve blocks were discussed with the patient.  Risks and benefits were reviewed.  Patient would like to proceed with LEFT C4, C5, C6, C7 medial branch nerve block.  Plan: -Left C4, C5, C6, C7 cervical facet medial branch nerve block with sedation under fluoroscopy.  Plan of Care   Lab-work, procedure(s), and/or referral(s): Orders Placed This Encounter  Procedures  . CERVICAL FACET (MEDIAL BRANCH NERVE BLOCK)    Provider-requested follow-up: Return for Procedure.  Future Appointments  Date Time Provider Regino Ramirez  01/11/2018  9:00 AM Gillis Santa, MD [facility] None   Time Note: Greater than 50% of the 25 minute(s) of face-to-face time spent with Mr. Doescher, was spent in counseling/coordination of care regarding: the appropriate use of the pain scale, Mr. Salvato's primary cause of pain, the treatment plan, treatment alternatives, the risks and possible complications of proposed treatment, going over the informed consent, the results, interpretation and significance of  his recent diagnostic interventional treatment(s), realistic expectations and the goals of pain management (increased in functionality).  Primary Care Physician: [facility] Location: [facility] Note by: Gillis Santa, M.D Date: 12/14/2017; Time: 3:38 PM  Patient Instructions   ____________________________________________________________________________________________  General Risks and Possible Complications  Patient Responsibilities: It is important that you read this as it is part of your informed consent. It is our duty to inform you of the risks and possible complications associated with treatments offered to you. It is your responsibility as a patient to read this and to ask questions about anything that is not clear or that you believe was not covered in this document.  Patient's Rights: You have the right to refuse treatment. You also have the right to change your mind, even after initially having agreed to  have the treatment done. However, under this last option, if you wait until the last second to change your mind, you may be charged for the materials used up to that point.  Introduction: Medicine is not an Chief Strategy Officer. Everything in Medicine, including the lack of treatment(s), carries the potential for danger, harm, or loss (which is by definition: Risk). In Medicine, a complication is a secondary problem, condition, or disease that can aggravate an already existing one. All treatments carry the risk of possible complications. The fact that a side effects or complications occurs, does not imply that the treatment was conducted incorrectly. It must be clearly understood that these can happen even when everything is done following the highest safety standards.  No treatment: You can choose not to proceed with the proposed treatment alternative. The "PRO(s)" would include: avoiding the risk of complications associated with the therapy. The "CON(s)" would include: not getting any of the treatment benefits. These benefits fall under one of three categories: diagnostic; therapeutic; and/or palliative. Diagnostic benefits include: getting information which can ultimately lead to improvement of the disease or symptom(s). Therapeutic benefits are those associated with  the successful treatment of the disease. Finally, palliative benefits are those related to the decrease of the primary symptoms, without necessarily curing the condition (example: decreasing the pain from a flare-up of a chronic condition, such as incurable terminal cancer).  General Risks and Complications: These are associated to most interventional treatments. They can occur alone, or in combination. They fall under one of the following six (6) categories: no benefit or worsening of symptoms; bleeding; infection; nerve damage; allergic reactions; and/or death. 1. No benefits or worsening of symptoms: In Medicine there are no guarantees, only probabilities. No healthcare provider can ever guarantee that a medical treatment will work, they can only state the probability that it may. Furthermore, there is always the possibility that the condition may worsen, either directly, or indirectly, as a consequence of the treatment. 2. Bleeding: This is more common if the patient is taking a blood thinner, either prescription or over the counter (example: Goody Powders, Fish oil, Aspirin, Garlic, etc.), or if suffering a condition associated with impaired coagulation (example: Hemophilia, cirrhosis of the liver, low platelet counts, etc.). However, even if you do not have one on these, it can still happen. If you have any of these conditions, or take one of these drugs, make sure to notify your treating physician. 3. Infection: This is more common in patients with a compromised immune system, either due to disease (example: diabetes, cancer, human immunodeficiency virus [HIV], etc.), or due to medications or treatments (example: therapies used to treat cancer and rheumatological diseases). However, even if you do not have one on these, it can still happen. If you have any of these conditions, or take one of these drugs, make sure to notify your treating physician. 4. Nerve Damage: This is more common when the treatment  is an invasive one, but it can also happen with the use of medications, such as those used in the treatment of cancer. The damage can occur to small secondary nerves, or to large primary ones, such as those in the spinal cord and brain. This damage may be temporary or permanent and it may lead to impairments that can range from temporary numbness to permanent paralysis and/or brain death. 5. Allergic Reactions: Any time a substance or material comes in contact with our body, there is the possibility of an allergic reaction. These can range from  a mild skin rash (contact dermatitis) to a severe systemic reaction (anaphylactic reaction), which can result in death. 6. Death: In general, any medical intervention can result in death, most of the time due to an unforeseen complication. ____________________________________________________________________________________________  ____________________________________________________________________________________________  Preparing for Procedure with Sedation  Instructions: . Oral Intake: Do not eat or drink anything for at least 8 hours prior to your procedure. . Transportation: Public transportation is not allowed. Bring an adult driver. The driver must be physically present in our waiting room before any procedure can be started. Marland Kitchen Physical Assistance: Bring an adult physically capable of assisting you, in the event you need help. This adult should keep you company at home for at least 6 hours after the procedure. . Blood Pressure Medicine: Take your blood pressure medicine with a sip of water the morning of the procedure. . Blood thinners: Notify our staff if you are taking any blood thinners. Depending on which one you take, there will be specific instructions on how and when to stop it. . Diabetics on insulin: Notify the staff so that you can be scheduled 1st case in the morning. If your diabetes requires high dose insulin, take only  of your normal  insulin dose the morning of the procedure and notify the staff that you have done so. . Preventing infections: Shower with an antibacterial soap the morning of your procedure. . Build-up your immune system: Take 1000 mg of Vitamin C with every meal (3 times a day) the day prior to your procedure. Marland Kitchen Antibiotics: Inform the staff if you have a condition or reason that requires you to take antibiotics before dental procedures. . Pregnancy: If you are pregnant, call and cancel the procedure. . Sickness: If you have a cold, fever, or any active infections, call and cancel the procedure. . Arrival: You must be in the facility at least 30 minutes prior to your scheduled procedure. . Children: Do not bring children with you. . Dress appropriately: Bring dark clothing that you would not mind if they get stained. . Valuables: Do not bring any jewelry or valuables.  Procedure appointments are reserved for interventional treatments only. Marland Kitchen No Prescription Refills. . No medication changes will be discussed during procedure appointments. . No disability issues will be discussed.  Reasons to call and reschedule or cancel your procedure: (Following these recommendations will minimize the risk of a serious complication.) . Surgeries: Avoid having procedures within 2 weeks of any surgery. (Avoid for 2 weeks before or after any surgery). . Flu Shots: Avoid having procedures within 2 weeks of a flu shots or . (Avoid for 2 weeks before or after immunizations). . Barium: Avoid having a procedure within 7-10 days after having had a radiological study involving the use of radiological contrast. (Myelograms, Barium swallow or enema study). . Heart attacks: Avoid any elective procedures or surgeries for the initial 6 months after a "Myocardial Infarction" (Heart Attack). . Blood thinners: It is imperative that you stop these medications before procedures. Let us know if you if you take any blood thinner.  . Infection:  Avoid procedures during or within two weeks of an infection (including chest colds or gastrointestinal problems). Symptoms associated with infections include: Localized redness, fever, chills, night sweats or profuse sweating, burning sensation when voiding, cough, congestion, stuffiness, runny nose, sore throat, diarrhea, nausea, vomiting, cold or Flu symptoms, recent or current infections. It is specially important if the infection is over the area that we intend to treat. Marland Kitchen Heart and  lung problems: Symptoms that may suggest an active cardiopulmonary problem include: cough, chest pain, breathing difficulties or shortness of breath, dizziness, ankle swelling, uncontrolled high or unusually low blood pressure, and/or palpitations. If you are experiencing any of these symptoms, cancel your procedure and contact your primary care physician for an evaluation.  Remember:  Regular Business hours are:  Monday to Thursday 8:00 AM to 4:00 PM  Provider's Schedule: Milinda Pointer, MD:  Procedure days: Tuesday and Thursday 7:30 AM to 4:00 PM  Gillis Santa, MD:  Procedure days: Monday and Wednesday 7:30 AM to 4:00 PM ____________________________________________________________________________________________  Facet Blocks Patient Information  Description: The facets are joints in the spine between the vertebrae.  Like any joints in the body, facets can become irritated and painful.  Arthritis can also effect the facets.  By injecting steroids and local anesthetic in and around these joints, we can temporarily block the nerve supply to them.  Steroids act directly on irritated nerves and tissues to reduce selling and inflammation which often leads to decreased pain.  Facet blocks may be done anywhere along the spine from the neck to the low back depending upon the location of your pain.   After numbing the skin with local anesthetic (like Novocaine), a small needle is passed onto the facet joints under  x-ray guidance.  You may experience a sensation of pressure while this is being done.  The entire block usually lasts about 15-25 minutes.   Conditions which may be treated by facet blocks:   Low back/buttock pain  Neck/shoulder pain  Certain types of headaches  Preparation for the injection:  1. Do not eat any solid food or dairy products within 8 hours of your appointment. 2. You may drink clear liquid up to 3 hours before appointment.  Clear liquids include water, black coffee, juice or soda.  No milk or cream please. 3. You may take your regular medication, including pain medications, with a sip of water before your appointment.  Diabetics should hold regular insulin (if taken separately) and take 1/2 normal NPH dose the morning of the procedure.  Carry some sugar containing items with you to your appointment. 4. A driver must accompany you and be prepared to drive you home after your procedure. 5. Bring all your current medications with you. 6. An IV may be inserted and sedation may be given at the discretion of the physician. 7. A blood pressure cuff, EKG and other monitors will often be applied during the procedure.  Some patients may need to have extra oxygen administered for a short period. 8. You will be asked to provide medical information, including your allergies and medications, prior to the procedure.  We must know immediately if you are taking blood thinners (like Coumadin/Warfarin) or if you are allergic to IV iodine contrast (dye).  We must know if you could possible be pregnant.  Possible side-effects:   Bleeding from needle site  Infection (rare, may require surgery)  Nerve injury (rare)  Numbness & tingling (temporary)  Difficulty urinating (rare, temporary)  Spinal headache (a headache worse with upright posture)  Light-headedness (temporary)  Pain at injection site (serveral days)  Decreased blood pressure (rare, temporary)  Weakness in arm/leg  (temporary)  Pressure sensation in back/neck (temporary)   Call if you experience:   Fever/chills associated with headache or increased back/neck pain  Headache worsened by an upright position  New onset, weakness or numbness of an extremity below the injection site  Hives or difficulty breathing (  go to the emergency room)  Inflammation or drainage at the injection site(s)  Severe back/neck pain greater than usual  New symptoms which are concerning to you  Please note:  Although the local anesthetic injected can often make your back or neck feel good for several hours after the injection, the pain will likely return. It takes 3-7 days for steroids to work.  You may not notice any pain relief for at least one week.  If effective, we will often do a series of 2-3 injections spaced 3-6 weeks apart to maximally decrease your pain.  After the initial series, you may be a candidate for a more permanent nerve block of the facets.  If you have any questions, please call #[phone]) [facility]

## 2017-12-14 NOTE — Progress Notes (Signed)
Safety precautions to be maintained throughout the outpatient stay will include: orient to surroundings, keep bed in low position, maintain call bell within reach at all times, provide assistance with transfer out of bed and ambulation.  

## 2017-12-14 NOTE — Patient Instructions (Signed)
____________________________________________________________________________________________  General Risks and Possible Complications  Patient Responsibilities: It is important that you read this as it is part of your informed consent. It is our duty to inform you of the risks and possible complications associated with treatments offered to you. It is your responsibility as a patient to read this and to ask questions about anything that is not clear or that you believe was not covered in this document.  Patient's Rights: You have the right to refuse treatment. You also have the right to change your mind, even after initially having agreed to have the treatment done. However, under this last option, if you wait until the last second to change your mind, you may be charged for the materials used up to that point.  Introduction: Medicine is not an exact science. Everything in Medicine, including the lack of treatment(s), carries the potential for danger, harm, or loss (which is by definition: Risk). In Medicine, a complication is a secondary problem, condition, or disease that can aggravate an already existing one. All treatments carry the risk of possible complications. The fact that a side effects or complications occurs, does not imply that the treatment was conducted incorrectly. It must be clearly understood that these can happen even when everything is done following the highest safety standards.  No treatment: You can choose not to proceed with the proposed treatment alternative. The "PRO(s)" would include: avoiding the risk of complications associated with the therapy. The "CON(s)" would include: not getting any of the treatment benefits. These benefits fall under one of three categories: diagnostic; therapeutic; and/or palliative. Diagnostic benefits include: getting information which can ultimately lead to improvement of the disease or symptom(s). Therapeutic benefits are those associated with the  successful treatment of the disease. Finally, palliative benefits are those related to the decrease of the primary symptoms, without necessarily curing the condition (example: decreasing the pain from a flare-up of a chronic condition, such as incurable terminal cancer).  General Risks and Complications: These are associated to most interventional treatments. They can occur alone, or in combination. They fall under one of the following six (6) categories: no benefit or worsening of symptoms; bleeding; infection; nerve damage; allergic reactions; and/or death. 1. No benefits or worsening of symptoms: In Medicine there are no guarantees, only probabilities. No healthcare provider can ever guarantee that a medical treatment will work, they can only state the probability that it may. Furthermore, there is always the possibility that the condition may worsen, either directly, or indirectly, as a consequence of the treatment. 2. Bleeding: This is more common if the patient is taking a blood thinner, either prescription or over the counter (example: Goody Powders, Fish oil, Aspirin, Garlic, etc.), or if suffering a condition associated with impaired coagulation (example: Hemophilia, cirrhosis of the liver, low platelet counts, etc.). However, even if you do not have one on these, it can still happen. If you have any of these conditions, or take one of these drugs, make sure to notify your treating physician. 3. Infection: This is more common in patients with a compromised immune system, either due to disease (example: diabetes, cancer, human immunodeficiency virus [HIV], etc.), or due to medications or treatments (example: therapies used to treat cancer and rheumatological diseases). However, even if you do not have one on these, it can still happen. If you have any of these conditions, or take one of these drugs, make sure to notify your treating physician. 4. Nerve Damage: This is more common when the   treatment is  an invasive one, but it can also happen with the use of medications, such as those used in the treatment of cancer. The damage can occur to small secondary nerves, or to large primary ones, such as those in the spinal cord and brain. This damage may be temporary or permanent and it may lead to impairments that can range from temporary numbness to permanent paralysis and/or brain death. 5. Allergic Reactions: Any time a substance or material comes in contact with our body, there is the possibility of an allergic reaction. These can range from a mild skin rash (contact dermatitis) to a severe systemic reaction (anaphylactic reaction), which can result in death. 6. Death: In general, any medical intervention can result in death, most of the time due to an unforeseen complication. ____________________________________________________________________________________________  ____________________________________________________________________________________________  Preparing for Procedure with Sedation  Instructions: . Oral Intake: Do not eat or drink anything for at least 8 hours prior to your procedure. . Transportation: Public transportation is not allowed. Bring an adult driver. The driver must be physically present in our waiting room before any procedure can be started. Marland Kitchen Physical Assistance: Bring an adult physically capable of assisting you, in the event you need help. This adult should keep you company at home for at least 6 hours after the procedure. . Blood Pressure Medicine: Take your blood pressure medicine with a sip of water the morning of the procedure. . Blood thinners: Notify our staff if you are taking any blood thinners. Depending on which one you take, there will be specific instructions on how and when to stop it. . Diabetics on insulin: Notify the staff so that you can be scheduled 1st case in the morning. If your diabetes requires high dose insulin, take only  of your normal  insulin dose the morning of the procedure and notify the staff that you have done so. . Preventing infections: Shower with an antibacterial soap the morning of your procedure. . Build-up your immune system: Take 1000 mg of Vitamin C with every meal (3 times a day) the day prior to your procedure. Marland Kitchen Antibiotics: Inform the staff if you have a condition or reason that requires you to take antibiotics before dental procedures. . Pregnancy: If you are pregnant, call and cancel the procedure. . Sickness: If you have a cold, fever, or any active infections, call and cancel the procedure. . Arrival: You must be in the facility at least 30 minutes prior to your scheduled procedure. . Children: Do not bring children with you. . Dress appropriately: Bring dark clothing that you would not mind if they get stained. . Valuables: Do not bring any jewelry or valuables.  Procedure appointments are reserved for interventional treatments only. Marland Kitchen No Prescription Refills. . No medication changes will be discussed during procedure appointments. . No disability issues will be discussed.  Reasons to call and reschedule or cancel your procedure: (Following these recommendations will minimize the risk of a serious complication.) . Surgeries: Avoid having procedures within 2 weeks of any surgery. (Avoid for 2 weeks before or after any surgery). . Flu Shots: Avoid having procedures within 2 weeks of a flu shots or . (Avoid for 2 weeks before or after immunizations). . Barium: Avoid having a procedure within 7-10 days after having had a radiological study involving the use of radiological contrast. (Myelograms, Barium swallow or enema study). . Heart attacks: Avoid any elective procedures or surgeries for the initial 6 months after a "Myocardial Infarction" (Heart Attack). Marland Kitchen  Blood thinners: It is imperative that you stop these medications before procedures. Let us know if you if you take any blood thinner.  . Infection:  Avoid procedures during or within two weeks of an infection (including chest colds or gastrointestinal problems). Symptoms associated with infections include: Localized redness, fever, chills, night sweats or profuse sweating, burning sensation when voiding, cough, congestion, stuffiness, runny nose, sore throat, diarrhea, nausea, vomiting, cold or Flu symptoms, recent or current infections. It is specially important if the infection is over the area that we intend to treat. Marland Kitchen Heart and lung problems: Symptoms that may suggest an active cardiopulmonary problem include: cough, chest pain, breathing difficulties or shortness of breath, dizziness, ankle swelling, uncontrolled high or unusually low blood pressure, and/or palpitations. If you are experiencing any of these symptoms, cancel your procedure and contact your primary care physician for an evaluation.  Remember:  Regular Business hours are:  Monday to Thursday 8:00 AM to 4:00 PM  Provider's Schedule: Milinda Pointer, MD:  Procedure days: Tuesday and Thursday 7:30 AM to 4:00 PM  Gillis Santa, MD:  Procedure days: Monday and Wednesday 7:30 AM to 4:00 PM ____________________________________________________________________________________________  Facet Blocks Patient Information  Description: The facets are joints in the spine between the vertebrae.  Like any joints in the body, facets can become irritated and painful.  Arthritis can also effect the facets.  By injecting steroids and local anesthetic in and around these joints, we can temporarily block the nerve supply to them.  Steroids act directly on irritated nerves and tissues to reduce selling and inflammation which often leads to decreased pain.  Facet blocks may be done anywhere along the spine from the neck to the low back depending upon the location of your pain.   After numbing the skin with local anesthetic (like Novocaine), a small needle is passed onto the facet joints under  x-ray guidance.  You may experience a sensation of pressure while this is being done.  The entire block usually lasts about 15-25 minutes.   Conditions which may be treated by facet blocks:   Low back/buttock pain  Neck/shoulder pain  Certain types of headaches  Preparation for the injection:  1. Do not eat any solid food or dairy products within 8 hours of your appointment. 2. You may drink clear liquid up to 3 hours before appointment.  Clear liquids include water, black coffee, juice or soda.  No milk or cream please. 3. You may take your regular medication, including pain medications, with a sip of water before your appointment.  Diabetics should hold regular insulin (if taken separately) and take 1/2 normal NPH dose the morning of the procedure.  Carry some sugar containing items with you to your appointment. 4. A driver must accompany you and be prepared to drive you home after your procedure. 5. Bring all your current medications with you. 6. An IV may be inserted and sedation may be given at the discretion of the physician. 7. A blood pressure cuff, EKG and other monitors will often be applied during the procedure.  Some patients may need to have extra oxygen administered for a short period. 8. You will be asked to provide medical information, including your allergies and medications, prior to the procedure.  We must know immediately if you are taking blood thinners (like Coumadin/Warfarin) or if you are allergic to IV iodine contrast (dye).  We must know if you could possible be pregnant.  Possible side-effects:   Bleeding from needle site  Infection (rare, may require surgery)  Nerve injury (rare)  Numbness & tingling (temporary)  Difficulty urinating (rare, temporary)  Spinal headache (a headache worse with upright posture)  Light-headedness (temporary)  Pain at injection site (serveral days)  Decreased blood pressure (rare, temporary)  Weakness in arm/leg  (temporary)  Pressure sensation in back/neck (temporary)   Call if you experience:   Fever/chills associated with headache or increased back/neck pain  Headache worsened by an upright position  New onset, weakness or numbness of an extremity below the injection site  Hives or difficulty breathing (go to the emergency room)  Inflammation or drainage at the injection site(s)  Severe back/neck pain greater than usual  New symptoms which are concerning to you  Please note:  Although the local anesthetic injected can often make your back or neck feel good for several hours after the injection, the pain will likely return. It takes 3-7 days for steroids to work.  You may not notice any pain relief for at least one week.  If effective, we will often do a series of 2-3 injections spaced 3-6 weeks apart to maximally decrease your pain.  After the initial series, you may be a candidate for a more permanent nerve block of the facets.  If you have any questions, please call #[phone]) [facility]

## 2018-01-11 ENCOUNTER — Ambulatory Visit
Payer: Medicaid Other | Attending: Student in an Organized Health Care Education/Training Program | Admitting: Student in an Organized Health Care Education/Training Program

## 2018-01-11 ENCOUNTER — Other Ambulatory Visit: Payer: Self-pay

## 2018-01-11 ENCOUNTER — Encounter: Payer: Self-pay | Admitting: Student in an Organized Health Care Education/Training Program

## 2018-01-11 ENCOUNTER — Ambulatory Visit: Admission: RE | Admit: 2018-01-11 | Payer: Medicaid Other | Source: Ambulatory Visit

## 2018-01-11 VITALS — BP 123/91 | HR 67 | Temp 98.1°F | Resp 18 | Ht 71.5 in | Wt 202.0 lb

## 2018-01-11 DIAGNOSIS — G894 Chronic pain syndrome: Secondary | ICD-10-CM

## 2018-01-11 DIAGNOSIS — M47812 Spondylosis without myelopathy or radiculopathy, cervical region: Secondary | ICD-10-CM | POA: Diagnosis not present

## 2018-01-11 DIAGNOSIS — M4722 Other spondylosis with radiculopathy, cervical region: Secondary | ICD-10-CM

## 2018-01-11 MED ORDER — LACTATED RINGERS IV SOLN: 1000.0000 mL | Freq: Once | INTRAVENOUS | Status: DC

## 2018-01-11 MED ORDER — FENTANYL CITRATE (PF) 100 MCG/2ML IJ SOLN: 25.0000 ug | INTRAMUSCULAR | Status: DC | PRN

## 2018-01-11 MED ORDER — LIDOCAINE HCL 2 % IJ SOLN: 20.0000 mL | Freq: Once | INTRAMUSCULAR | Status: DC

## 2018-01-11 MED ORDER — DEXAMETHASONE SODIUM PHOSPHATE 10 MG/ML IJ SOLN: 10.0000 mg | Freq: Once | INTRAMUSCULAR | Status: DC

## 2018-01-11 MED ORDER — ROPIVACAINE HCL 2 MG/ML IJ SOLN: 10.0000 mL | Freq: Once | INTRAMUSCULAR | Status: DC

## 2018-01-11 NOTE — Progress Notes (Signed)
Safety precautions to be maintained throughout the outpatient stay will include: orient to surroundings, keep bed in low position, maintain call bell within reach at all times, provide assistance with transfer out of bed and ambulation.  

## 2018-01-11 NOTE — Progress Notes (Signed)
Patient's Name: Mathew Robertson  MRN: [id number]  Referring Provider: Gillis Santa, MD  DOB: 1965/12/30  PCP: [facility] [place]  DOS: 01/11/2018  Note by: Gillis Santa, MD  Service setting: Ambulatory outpatient  Specialty: Interventional Pain Management  Location: [facility] (AMB) Pain Management Facility    Patient type: Established   Primary Reason(s) for Visit: Evaluation of chronic illnesses with exacerbation, or progression (Level of risk: moderate) CC: Neck Pain  HPI  Mathew Robertson is a 53 y.o. year old, male patient, who comes today for a follow-up evaluation. He has Chronic bilateral low back pain with bilateral sciatica; Lumbar degenerative disc disease; Chronic pain syndrome; Cervicalgia; Right cervical radiculopathy; Foraminal stenosis of cervical region (R); and Osteoarthritis of spine with radiculopathy, cervical region on their problem list. Mathew Robertson was last seen on 12/14/2017. His primarily concern today is the Neck Pain  Pain Assessment: Location:    Left neck Radiating: denies Onset: More than a month ago Duration: Chronic pain Quality: Aching(dull) Severity: 2 /10 (subjective, self-reported pain score)  Note: Reported level is compatible with observation.                         When using our objective Pain Scale, levels between 6 and 10/10 are said to belong in an emergency room, as it progressively worsens from a 6/10, described as severely limiting, requiring emergency care not usually available at an outpatient pain management facility. At a 6/10 level, communication becomes difficult and requires great effort. Assistance to reach the emergency department may be required. Facial flushing and profuse sweating along with potentially dangerous increases in heart rate and blood pressure will be evident. Effect on ADL:   Timing: Intermittent Modifying factors: has been pain free for 3 weeks until mild pain this am; states mobilty is much better than it was BP: (!) 123/91   HR: 67  Further details on both, my assessment(s), as well as the proposed treatment plan, please see below.  Patient was originally scheduled to have left-sided cervical facet medial branch nerve blocks at C4, C5, C6, C7 today.  He states that his neck pain and left shoulder pain has improved since his visit with me.  Patient is endorsing side effects at his current dose of gabapentin and states that he is having seizure-like activity.  I recommended that he discuss this with his neurologist who is managing his gabapentin but recommend that he decrease his dose to 600 mg twice daily to 3 times daily and see if that improves of symptoms.  If not, patient is instructed to contact his neurologist.  Patient was also started on Lexapro by his psychiatrist as well as Klonopin.  Laboratory Chemistry  Inflammation Markers (CRP: Acute Phase) (ESR: Chronic Phase) Lab Results  Component Value Date   CRP 3.7 12/22/2016   ESRSEDRATE 12 12/22/2016                         Rheumatology Markers No results found for: RF, ANA, LABURIC, URICUR, LYMEIGGIGMAB, LYMEABIGMQN, HLAB27                      Renal Function Markers Lab Results  Component Value Date   BUN 22 12/22/2016   CREATININE 1.12 12/22/2016   BCR 20 12/22/2016   GFRAA 87 12/22/2016   GFRNONAA 76 12/22/2016  Hepatic Function Markers Lab Results  Component Value Date   AST 16 12/22/2016   ALT 12 12/22/2016   ALBUMIN 4.8 12/22/2016   ALKPHOS 115 12/22/2016                        Electrolytes Lab Results  Component Value Date   NA 146 (H) 12/22/2016   K 3.8 12/22/2016   CL 104 12/22/2016   CALCIUM 9.1 12/22/2016   MG 2.2 12/22/2016                        Neuropathy Markers Lab Results  Component Value Date   VITAMINB12 280 12/22/2016                        CNS Tests No results found for: COLORCSF, APPEARCSF, RBCCOUNTCSF, WBCCSF, POLYSCSF, LYMPHSCSF, EOSCSF, PROTEINCSF, GLUCCSF, JCVIRUS, CSFOLI,  IGGCSF                      Bone Pathology Markers Lab Results  Component Value Date   25OHVITD1 25 (L) 12/22/2016   25OHVITD2 <1.0 12/22/2016   25OHVITD3 25 12/22/2016                         Coagulation Parameters No results found for: INR, LABPROT, APTT, PLT, DDIMER, LABHEMA, VITAMINK1                      Cardiovascular Markers No results found for: BNP, CKTOTAL, CKMB, TROPONINI, HGB, HCT                       CA Markers No results found for: CEA, CA125, LABCA2                      Note: Lab results reviewed.  Recent Diagnostic Imaging Review  Cervical Imaging: Cervical MR wo contrast:  Results for orders placed during the hospital encounter of 10/29/17  MR CERVICAL SPINE WO CONTRAST   Narrative CLINICAL DATA:  Right side neck pain radiating into the right arm with numbness in the right fingers. Symptoms for 2 months.  EXAM: MRI CERVICAL SPINE WITHOUT CONTRAST  TECHNIQUE: Multiplanar, multisequence MR imaging of the cervical spine was performed. No intravenous contrast was administered.  COMPARISON:  Plain film cervical spine 08/27/2017.  FINDINGS: Alignment: No listhesis. Mild reversal lordosis from C5-T1 is noted.  Vertebrae: No fracture or worrisome lesion. 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  Cord: Normal signal throughout.  Posterior Fossa, vertebral arteries, paraspinal tissues: Negative.  Disc levels:  C2-3: No disc bulge or protrusion. Right worse than left facet arthropathy. There is moderate to moderately severe right foraminal narrowing. The central canal and left foramen are open.  C3-4: Mild posterior bony ridging and uncovertebral disease. Mild to moderate facet degenerative change is worse on the left. The ventral thecal sac is slightly narrowed but the central canal is open. Severe left and moderate right foraminal narrowing is seen.  C4-5: The patient has a central disc protrusion  deforming the ventral cord. There is some uncovertebral spurring and mild facet arthropathy. Moderate to moderately severe bilateral foraminal narrowing is present.  C5-6: Disc bulge with a superimposed central protrusion. Disc indents the ventral cord in conjunction with reversal of lordosis. Mild to moderate bilateral foraminal  narrowing is seen.  C6-7: There is a disc bulge and left much worse than right uncovertebral disease. There is mild flattening of the scratch the cord is somewhat flattened due to disc and reversal lordosis. Moderately severe to severe foraminal narrowing is worse on the left.  C7-T1: There is a shallow disc bulge. Bilateral uncovertebral disease is worse on the right. The central canal is open. Severe right and moderately severe left foraminal narrowing is present.  IMPRESSION: Reversal of cervical lordosis from C5-T1.  Central disc protrusion at C4-5 deforms the ventral cord. Moderate to moderately severe bilateral foraminal narrowing is present at this level.  Disc indents the ventral cord at C5-6 in conjunction with reversal of lordosis. Mild to moderate bilateral foraminal narrowing is present at this level.  The cord is mildly flattened by disc and reversal of lordosis at C6-7. Moderately severe to severe foraminal narrowing is worse on the right.  Severe left and moderate right foraminal narrowing at C3-4 where the ventral thecal sac is slightly narrowed but not effaced.  Severe right and moderately severe left foraminal narrowing C7-T1.  Moderate to moderately severe right foraminal narrowing C2-3.   Electronically Signed   By: Inge Rise M.D.   On: 10/30/2017 10:40    Cervical DG Bending/F/E views:  Results for orders placed during the hospital encounter of 08/27/17  DG Cervical Spine With Flex & Extend   Narrative CLINICAL DATA:  Chronic neck pain radiating to right shoulder  EXAM: CERVICAL SPINE COMPLETE WITH FLEXION AND  EXTENSION VIEWS  COMPARISON:  None.  FINDINGS: Moderate to advanced degenerative disc disease at C6-7 with disc space narrowing and spurring anteriorly. No subluxation. Moderate multilevel bilateral neural foraminal narrowing due to degenerative facet disease and uncovertebral spurring. No fracture. Prevertebral soft tissues are normal.  IMPRESSION: Degenerative disc disease in the lower cervical spine. Diffuse degenerative facet disease bilaterally with diffuse multilevel bilateral neural foraminal narrowing. No acute bony abnormality.   Electronically Signed   By: Rolm Baptise M.D.   On: 08/27/2017 10:57     Results for orders placed during the hospital encounter of 08/27/17  DG Shoulder Right   Narrative CLINICAL DATA:  Chronic pain.  EXAM: RIGHT SHOULDER - 2+ VIEW  COMPARISON:  No prior.  FINDINGS: No acute bony or joint abnormality identified. No evidence of fracture or dislocation.  IMPRESSION: No acute abnormality.   Electronically Signed   By: Marcello Moores  Register   On: 08/27/2017 10:58     Complexity Note: Imaging results reviewed. Results shared with Mathew Robertson, using Layman's terms.                         Meds   Current Outpatient Medications:  .  acetaminophen-codeine (TYLENOL #3) 300-30 MG tablet, Take 1 tablet by mouth every 8 (eight) hours as needed., Disp: , Rfl: 0 .  ADVAIR DISKUS 250-50 MCG/DOSE AEPB, Take 1 puff by mouth as needed., Disp: , Rfl: 0 .  ASPIRIN LOW DOSE 81 MG EC tablet, Take 1 tablet by mouth daily., Disp: , Rfl: 3 .  capsaicin (ZOSTRIX) 0.025 % cream, Apply topically 2 (two) times daily., Disp: , Rfl:  .  clonazePAM (KLONOPIN) 1 MG tablet, Take 1 mg by mouth at bedtime., Disp: , Rfl: 0 .  escitalopram (LEXAPRO) 5 MG tablet, Take 5 mg by mouth at bedtime., Disp: , Rfl: 0 .  gabapentin (NEURONTIN) 600 MG tablet, Take 600 mg by mouth 4 (four) times daily.,  Disp: , Rfl: 1 .  hydrochlorothiazide (HYDRODIURIL) 25 MG tablet, Take 1  tablet by mouth daily., Disp: , Rfl: 2 .  nortriptyline (PAMELOR) 25 MG capsule, Take 2 capsules by mouth at bedtime as needed., Disp: , Rfl: 0 .  omeprazole (PRILOSEC) 40 MG capsule, TK ONE C PO QD, Disp: , Rfl: 0 .  Oxcarbazepine (TRILEPTAL) 300 MG tablet, Take 300 mg by mouth 2 (two) times daily., Disp: , Rfl:  .  PROAIR HFA 108 (90 Base) MCG/ACT inhaler, Take 2 puffs by mouth 4 (four) times daily as needed., Disp: , Rfl: 1 .  topiramate (TOPAMAX) 50 MG tablet, Take 100 mg by mouth 2 (two) times daily. 1 tablet in the am and 2 tablets nightly., Disp: , Rfl: 0 .  VIMPAT 200 MG TABS tablet, Take 1 tablet by mouth 2 (two) times daily., Disp: , Rfl: 0 .  ALPRAZolam (XANAX) 1 MG tablet, Take 1 tablet by mouth 3 (three) times daily., Disp: , Rfl: 0  ROS  Constitutional: Denies any fever or chills Gastrointestinal: No reported hemesis, hematochezia, vomiting, or acute GI distress Musculoskeletal: Denies any acute onset joint swelling, redness, loss of ROM, or weakness Neurological: No reported episodes of acute onset apraxia, aphasia, dysarthria, agnosia, amnesia, paralysis, loss of coordination, or loss of consciousness  Allergies  Mathew Robertson is allergic to dilantin [phenytoin] and sulfa antibiotics.  [place]  Drug: Mathew Robertson  reports no history of drug use. Alcohol:  reports previous alcohol use. Tobacco:  reports that he has been smoking cigarettes. He has a 5.00 pack-year smoking history. He has never used smokeless tobacco. Medical:  has a past medical history of Asthma, Chronic pain disorder, Headache, Hypertension, and Seizures ([place]). Surgical: Mathew Robertson  has a past surgical history that includes Back surgery; cosmetic eye surgery; Foot surgery; Nose surgery; Vasectomy; Esophagogastroduodenoscopy (egd) with propofol (N/A, 06/05/2017); and Colonoscopy with propofol (N/A, 06/05/2017). Family: family history includes Arthritis in his mother; Cancer in his mother; Mental illness in his  mother.  Constitutional Exam  General appearance: Well nourished, well developed, and well hydrated. In no apparent acute distress Vitals:   01/11/18 0849  BP: (!) 123/91  Pulse: 67  Resp: 18  Temp: 98.1 F (36.7 C)  TempSrc: Oral  SpO2: 100%  Weight: 202 lb (91.6 kg)  Height: 5' 11.5" (1.816 m)   BMI Assessment: Estimated body mass index is 27.78 kg/m as calculated from the following:   Height as of this encounter: 5' 11.5" (1.816 m).   Weight as of this encounter: 202 lb (91.6 kg).  BMI interpretation table: BMI level Category Range association with higher incidence of chronic pain  <18 kg/m2 Underweight   18.5-24.9 kg/m2 Ideal body weight   25-29.9 kg/m2 Overweight Increased incidence by 20%  30-34.9 kg/m2 Obese (Class I) Increased incidence by 68%  35-39.9 kg/m2 Severe obesity (Class II) Increased incidence by 136%  >40 kg/m2 Extreme obesity (Class III) Increased incidence by 254%   Patient's current BMI Ideal Body weight  Body mass index is 27.78 kg/m. Ideal body weight: 76.5 kg (168 lb 8.7 oz) Adjusted ideal body weight: 82.5 kg (181 lb 14.8 oz)   BMI Readings from Last 4 Encounters:  01/11/18 27.78 kg/m  12/14/17 28.87 kg/m  11/09/17 28.74 kg/m  09/17/17 29.57 kg/m   Wt Readings from Last 4 Encounters:  01/11/18 202 lb (91.6 kg)  12/14/17 207 lb (93.9 kg)  11/09/17 209 lb (94.8 kg)  09/17/17 212 lb (96.2 kg)  Psych/Mental status:  Alert, oriented x 3 (person, place, & time)       Eyes: PERLA Respiratory: No evidence of acute respiratory distress  Cervical Spine Area Exam  Skin & Axial Inspection: No masses, redness, edema, swelling, or associated skin lesions Alignment: Symmetrical Functional ROM: Unrestricted ROM      Stability: No instability detected Muscle Tone/Strength: Functionally intact. No obvious neuro-muscular anomalies detected. Sensory (Neurological): Unimpaired Palpation: No palpable anomalies              Upper Extremity (UE) Exam     Side: Right upper extremity  Side: Left upper extremity  Skin & Extremity Inspection: Skin color, temperature, and hair growth are WNL. No peripheral edema or cyanosis. No masses, redness, swelling, asymmetry, or associated skin lesions. No contractures.  Skin & Extremity Inspection: Skin color, temperature, and hair growth are WNL. No peripheral edema or cyanosis. No masses, redness, swelling, asymmetry, or associated skin lesions. No contractures.  Functional ROM: Unrestricted ROM          Functional ROM: Unrestricted ROM          Muscle Tone/Strength: Functionally intact. No obvious neuro-muscular anomalies detected.  Muscle Tone/Strength: Functionally intact. No obvious neuro-muscular anomalies detected.  Sensory (Neurological): Unimpaired          Sensory (Neurological): Unimpaired          Palpation: No palpable anomalies              Palpation: No palpable anomalies              Provocative Test(s):  Phalen's test: deferred Tinel's test: deferred Apley's scratch test (touch opposite shoulder):  Action 1 (Across chest): deferred Action 2 (Overhead): deferred Action 3 (LB reach): deferred   Provocative Test(s):  Phalen's test: deferred Tinel's test: deferred Apley's scratch test (touch opposite shoulder):  Action 1 (Across chest): deferred Action 2 (Overhead): deferred Action 3 (LB reach): deferred    Thoracic Spine Area Exam  Skin & Axial Inspection: No masses, redness, or swelling Alignment: Symmetrical Functional ROM: Unrestricted ROM Stability: No instability detected Muscle Tone/Strength: Functionally intact. No obvious neuro-muscular anomalies detected. Sensory (Neurological): Unimpaired Muscle strength & Tone: No palpable anomalies  Lumbar Spine Area Exam  Skin & Axial Inspection: No masses, redness, or swelling Alignment: Symmetrical Functional ROM: Unrestricted ROM       Stability: No instability detected Muscle Tone/Strength: Functionally intact. No obvious  neuro-muscular anomalies detected. Sensory (Neurological): Unimpaired Palpation: No palpable anomalies       Provocative Tests: Hyperextension/rotation test: deferred today       Lumbar quadrant test (Kemp's test): deferred today       Lateral bending test: deferred today       Patrick's Maneuver: deferred today                   FABER* test: deferred today                   S-I anterior distraction/compression test: deferred today         S-I lateral compression test: deferred today         S-I Thigh-thrust test: deferred today         S-I Gaenslen's test: deferred today         *(Flexion, ABduction and External Rotation)  Gait & Posture Assessment  Ambulation: Unassisted Gait: Relatively normal for age and body habitus Posture: WNL   Lower Extremity Exam    Side: Right lower extremity  Side: Left  lower extremity  Stability: No instability observed          Stability: No instability observed          Skin & Extremity Inspection: Skin color, temperature, and hair growth are WNL. No peripheral edema or cyanosis. No masses, redness, swelling, asymmetry, or associated skin lesions. No contractures.  Skin & Extremity Inspection: Skin color, temperature, and hair growth are WNL. No peripheral edema or cyanosis. No masses, redness, swelling, asymmetry, or associated skin lesions. No contractures.  Functional ROM: Unrestricted ROM                  Functional ROM: Unrestricted ROM                  Muscle Tone/Strength: Functionally intact. No obvious neuro-muscular anomalies detected.  Muscle Tone/Strength: Functionally intact. No obvious neuro-muscular anomalies detected.  Sensory (Neurological): Unimpaired        Sensory (Neurological): Unimpaired        DTR: Patellar: deferred today Achilles: deferred today Plantar: deferred today  DTR: Patellar: deferred today Achilles: deferred today Plantar: deferred today  Palpation: No palpable anomalies  Palpation: No palpable anomalies    Assessment  Primary Diagnosis & Pertinent Problem List: The primary encounter diagnosis was Osteoarthritis of spine with radiculopathy, cervical region. Diagnoses of Cervical spondylosis, Cervical facet syndrome, and Chronic pain syndrome were also pertinent to this visit.  Status Diagnosis  Controlled Controlled Controlled 1. Osteoarthritis of spine with radiculopathy, cervical region   2. Cervical spondylosis   3. Cervical facet syndrome   4. Chronic pain syndrome      Improvement in left cervical facet related pain since last visit.  Will defer left C4-C7 diagnostic cervical facet medial branch nerve blocks until patient has a pain flare.  I have placed as needed order for left C4, C5, C6, C7 diagnostic cervical facet medial branch nerve block under fluoroscopy with sedation.  I also recommended the patient reduce his gabapentin to 600 mg twice daily to 3 times daily since he is endorsing side effects of seizure-like activity.  Recommended that he follow-up with his neurologist who is managing his gabapentin.  Plan of Care  Pharmacotherapy (Medications Ordered): Meds ordered this encounter  Medications  . DISCONTD: lactated ringers infusion 1,000 mL  . DISCONTD: fentaNYL (SUBLIMAZE) injection 25-100 mcg    Make sure Narcan is available in the pyxis when using this medication. In the event of respiratory depression (RR< 8/min): Titrate NARCAN (naloxone) in increments of 0.1 to 0.2 mg IV at 2-3 minute intervals, until desired degree of reversal.  . DISCONTD: lidocaine (XYLOCAINE) 2 % (with pres) injection 400 mg  . DISCONTD: ropivacaine (PF) 2 mg/mL (0.2%) (NAROPIN) injection 10 mL  . DISCONTD: dexamethasone (DECADRON) injection 10 mg   Lab-work, procedure(s), and/or referral(s): Orders Placed This Encounter  Procedures  . CERVICAL FACET (MEDIAL BRANCH NERVE BLOCK)      Future Appointments  Date Time Provider [place]  02/11/2018  1:30 PM Gillis Santa, MD [facility]  None    Primary Care Physician: [facility] Location: [facility] Outpatient [facility] Management Facility Note by: Gillis Santa, M.D Date: 01/11/2018; Time: 1:14 PM  Patient Instructions  Decrease Gabapentin to 600 mg BID- TID given side effects.____________________________________________________________________________________________  Preparing for your procedure (without sedation)  Instructions: . Oral Intake: Do not eat or drink anything for at least 3 hours prior to your procedure. . Transportation: Unless otherwise stated by your physician, you may drive yourself after the procedure. Marland Kitchen  Blood Pressure Medicine: Take your blood pressure medicine with a sip of water the morning of the procedure. . Blood thinners: Notify our staff if you are taking any blood thinners. Depending on which one you take, there will be specific instructions on how and when to stop it. . Diabetics on insulin: Notify the staff so that you can be scheduled 1st case in the morning. If your diabetes requires high dose insulin, take only  of your normal insulin dose the morning of the procedure and notify the staff that you have done so. . Preventing infections: Shower with an antibacterial soap the morning of your procedure.  . Build-up your immune system: Take 1000 mg of Vitamin C with every meal (3 times a day) the day prior to your procedure. Marland Kitchen Antibiotics: Inform the staff if you have a condition or reason that requires you to take antibiotics before dental procedures. . Pregnancy: If you are pregnant, call and cancel the procedure. . Sickness: If you have a cold, fever, or any active infections, call and cancel the procedure. . Arrival: You must be in the facility at least 30 minutes prior to your scheduled procedure. . Children: Do not bring any children with you. . Dress appropriately: Bring dark clothing that you would not mind if they get stained. . Valuables: Do not bring any jewelry or  valuables.  Procedure appointments are reserved for interventional treatments only. Marland Kitchen No Prescription Refills. . No medication changes will be discussed during procedure appointments. . No disability issues will be discussed.  Reasons to call and reschedule or cancel your procedure: (Following these recommendations will minimize the risk of a serious complication.) . Surgeries: Avoid having procedures within 2 weeks of any surgery. (Avoid for 2 weeks before or after any surgery). . Flu Shots: Avoid having procedures within 2 weeks of a flu shots or . (Avoid for 2 weeks before or after immunizations). . Barium: Avoid having a procedure within 7-10 days after having had a radiological study involving the use of radiological contrast. (Myelograms, Barium swallow or enema study). . Heart attacks: Avoid any elective procedures or surgeries for the initial 6 months after a "Myocardial Infarction" (Heart Attack). . Blood thinners: It is imperative that you stop these medications before procedures. Let us know if you if you take any blood thinner.  . Infection: Avoid procedures during or within two weeks of an infection (including chest colds or gastrointestinal problems). Symptoms associated with infections include: Localized redness, fever, chills, night sweats or profuse sweating, burning sensation when voiding, cough, congestion, stuffiness, runny nose, sore throat, diarrhea, nausea, vomiting, cold or Flu symptoms, recent or current infections. It is specially important if the infection is over the area that we intend to treat. Marland Kitchen Heart and lung problems: Symptoms that may suggest an active cardiopulmonary problem include: cough, chest pain, breathing difficulties or shortness of breath, dizziness, ankle swelling, uncontrolled high or unusually low blood pressure, and/or palpitations. If you are experiencing any of these symptoms, cancel your procedure and contact your primary care physician for an  evaluation.  Remember:  Regular Business hours are:  Monday to Thursday 8:00 AM to 4:00 PM  Provider's Schedule: Milinda Pointer, MD:  Procedure days: Tuesday and Thursday 7:30 AM to 4:00 PM  Gillis Santa, MD:  Procedure days: Monday and Wednesday 7:30 AM to 4:00 PM ____________________________________________________________________________________________   Epidural Steroid Injection An epidural steroid injection is a shot of steroid medicine and numbing medicine that is given into the space between  the spinal cord and the bones in your back (epidural space). The shot helps relieve pain caused by an irritated or swollen nerve root. The amount of pain relief you get from the injection depends on what is causing the nerve to be swollen and irritated, and how long your pain lasts. You are more likely to benefit from this injection if your pain is strong and comes on suddenly rather than if you have had pain for a long time. Tell a health care provider about:   Any allergies you have.  All medicines you are taking, including vitamins, herbs, eye drops, creams, and over-the-counter medicines.  Any problems you or family members have had with anesthetic medicines.  Any blood disorders you have.  Any surgeries you have had.  Any medical conditions you have.  Whether you are pregnant or may be pregnant. What are the risks? Generally, this is a safe procedure. However, problems may occur, including:  Headache.  Bleeding.  Infection.  Allergic reaction to medicines.  Damage to your nerves. What happens before the procedure? Staying hydrated Follow instructions from your health care provider about hydration, which may include:  Up to 2 hours before the procedure - you may continue to drink clear liquids, such as water, clear fruit juice, black coffee, and plain tea. Eating and drinking restrictions Follow instructions from your health care provider about eating and  drinking, which may include:  8 hours before the procedure - stop eating heavy meals or foods such as meat, fried foods, or fatty foods.  6 hours before the procedure - stop eating light meals or foods, such as toast or cereal.  6 hours before the procedure - stop drinking milk or drinks that contain milk.  2 hours before the procedure - stop drinking clear liquids. Medicine  You may be given medicines to lower anxiety.  Ask your health care provider about: ? Changing or stopping your regular medicines. This is especially important if you are taking diabetes medicines or blood thinners. ? Taking medicines such as aspirin and ibuprofen. These medicines can thin your blood. Do not take these medicines before your procedure if your health care provider instructs you not to. General instructions  Plan to have someone take you home from the hospital or clinic. What happens during the procedure?  You may receive a medicine to help you relax (sedative).  You will be asked to lie on your abdomen.  The injection site will be cleaned.  A numbing medicine (local anesthetic) will be used to numb the injection site.  A needle will be inserted through your skin into the epidural space. You may feel some discomfort when this happens. An X-ray machine will be used to make sure the needle is put as close as possible to the affected nerve.  A steroid medicine and a local anesthetic will be injected into the epidural space.  The needle will be removed.  A bandage (dressing) will be put over the injection site. What happens after the procedure?  Your blood pressure, heart rate, breathing rate, and blood oxygen level will be monitored until the medicines you were given have worn off.  Your arm or leg may feel weak or numb for a few hours.  The injection site may feel sore.  Do not drive for 24 hours if you received a sedative. This information is not intended to replace advice given to you by  your health care provider. Make sure you discuss any questions you have with  your health care provider. Document Released: 04/01/2007 Document Revised: 09/10/2016 Document Reviewed: 04/10/2015 Elsevier Interactive Patient Education  2019 [employer].

## 2018-01-11 NOTE — Patient Instructions (Addendum)
Decrease Gabapentin to 600 mg BID- TID given side effects.____________________________________________________________________________________________  Preparing for your procedure (without sedation)  Instructions: . Oral Intake: Do not eat or drink anything for at least 3 hours prior to your procedure. . Transportation: Unless otherwise stated by your physician, you may drive yourself after the procedure. . Blood Pressure Medicine: Take your blood pressure medicine with a sip of water the morning of the procedure. . Blood thinners: Notify our staff if you are taking any blood thinners. Depending on which one you take, there will be specific instructions on how and when to stop it. . Diabetics on insulin: Notify the staff so that you can be scheduled 1st case in the morning. If your diabetes requires high dose insulin, take only  of your normal insulin dose the morning of the procedure and notify the staff that you have done so. . Preventing infections: Shower with an antibacterial soap the morning of your procedure.  . Build-up your immune system: Take 1000 mg of Vitamin C with every meal (3 times a day) the day prior to your procedure. Marland Kitchen Antibiotics: Inform the staff if you have a condition or reason that requires you to take antibiotics before dental procedures. . Pregnancy: If you are pregnant, call and cancel the procedure. . Sickness: If you have a cold, fever, or any active infections, call and cancel the procedure. . Arrival: You must be in the facility at least 30 minutes prior to your scheduled procedure. . Children: Do not bring any children with you. . Dress appropriately: Bring dark clothing that you would not mind if they get stained. . Valuables: Do not bring any jewelry or valuables.  Procedure appointments are reserved for interventional treatments only. Marland Kitchen No Prescription Refills. . No medication changes will be discussed during procedure appointments. . No disability issues  will be discussed.  Reasons to call and reschedule or cancel your procedure: (Following these recommendations will minimize the risk of a serious complication.) . Surgeries: Avoid having procedures within 2 weeks of any surgery. (Avoid for 2 weeks before or after any surgery). . Flu Shots: Avoid having procedures within 2 weeks of a flu shots or . (Avoid for 2 weeks before or after immunizations). . Barium: Avoid having a procedure within 7-10 days after having had a radiological study involving the use of radiological contrast. (Myelograms, Barium swallow or enema study). . Heart attacks: Avoid any elective procedures or surgeries for the initial 6 months after a "Myocardial Infarction" (Heart Attack). . Blood thinners: It is imperative that you stop these medications before procedures. Let us know if you if you take any blood thinner.  . Infection: Avoid procedures during or within two weeks of an infection (including chest colds or gastrointestinal problems). Symptoms associated with infections include: Localized redness, fever, chills, night sweats or profuse sweating, burning sensation when voiding, cough, congestion, stuffiness, runny nose, sore throat, diarrhea, nausea, vomiting, cold or Flu symptoms, recent or current infections. It is specially important if the infection is over the area that we intend to treat. Marland Kitchen Heart and lung problems: Symptoms that may suggest an active cardiopulmonary problem include: cough, chest pain, breathing difficulties or shortness of breath, dizziness, ankle swelling, uncontrolled high or unusually low blood pressure, and/or palpitations. If you are experiencing any of these symptoms, cancel your procedure and contact your primary care physician for an evaluation.  Remember:  Regular Business hours are:  Monday to Thursday 8:00 AM to 4:00 PM  Provider's Schedule: Milinda Pointer, MD:  Procedure days: Tuesday and Thursday 7:30 AM to 4:00 PM  Gillis Santa,  MD:  Procedure days: Monday and Wednesday 7:30 AM to 4:00 PM ____________________________________________________________________________________________   Epidural Steroid Injection An epidural steroid injection is a shot of steroid medicine and numbing medicine that is given into the space between the spinal cord and the bones in your back (epidural space). The shot helps relieve pain caused by an irritated or swollen nerve root. The amount of pain relief you get from the injection depends on what is causing the nerve to be swollen and irritated, and how long your pain lasts. You are more likely to benefit from this injection if your pain is strong and comes on suddenly rather than if you have had pain for a long time. Tell a health care provider about:   Any allergies you have.  All medicines you are taking, including vitamins, herbs, eye drops, creams, and over-the-counter medicines.  Any problems you or family members have had with anesthetic medicines.  Any blood disorders you have.  Any surgeries you have had.  Any medical conditions you have.  Whether you are pregnant or may be pregnant. What are the risks? Generally, this is a safe procedure. However, problems may occur, including:  Headache.  Bleeding.  Infection.  Allergic reaction to medicines.  Damage to your nerves. What happens before the procedure? Staying hydrated Follow instructions from your health care provider about hydration, which may include:  Up to 2 hours before the procedure - you may continue to drink clear liquids, such as water, clear fruit juice, black coffee, and plain tea. Eating and drinking restrictions Follow instructions from your health care provider about eating and drinking, which may include:  8 hours before the procedure - stop eating heavy meals or foods such as meat, fried foods, or fatty foods.  6 hours before the procedure - stop eating light meals or foods, such as toast or  cereal.  6 hours before the procedure - stop drinking milk or drinks that contain milk.  2 hours before the procedure - stop drinking clear liquids. Medicine  You may be given medicines to lower anxiety.  Ask your health care provider about: ? Changing or stopping your regular medicines. This is especially important if you are taking diabetes medicines or blood thinners. ? Taking medicines such as aspirin and ibuprofen. These medicines can thin your blood. Do not take these medicines before your procedure if your health care provider instructs you not to. General instructions  Plan to have someone take you home from the hospital or clinic. What happens during the procedure?  You may receive a medicine to help you relax (sedative).  You will be asked to lie on your abdomen.  The injection site will be cleaned.  A numbing medicine (local anesthetic) will be used to numb the injection site.  A needle will be inserted through your skin into the epidural space. You may feel some discomfort when this happens. An X-ray machine will be used to make sure the needle is put as close as possible to the affected nerve.  A steroid medicine and a local anesthetic will be injected into the epidural space.  The needle will be removed.  A bandage (dressing) will be put over the injection site. What happens after the procedure?  Your blood pressure, heart rate, breathing rate, and blood oxygen level will be monitored until the medicines you were given have worn off.  Your arm or leg may feel  weak or numb for a few hours.  The injection site may feel sore.  Do not drive for 24 hours if you received a sedative. This information is not intended to replace advice given to you by your health care provider. Make sure you discuss any questions you have with your health care provider. Document Released: 04/01/2007 Document Revised: 09/10/2016 Document Reviewed: 04/10/2015 Elsevier Interactive Patient  Education  2019 [employer].

## 2018-01-12 ENCOUNTER — Telehealth: Payer: Self-pay

## 2018-01-12 NOTE — Telephone Encounter (Signed)
Pt was called and no problems was reported. 

## 2018-02-11 ENCOUNTER — Ambulatory Visit: Payer: Medicaid Other | Admitting: Student in an Organized Health Care Education/Training Program

## 2018-11-30 ENCOUNTER — Other Ambulatory Visit: Payer: Self-pay

## 2018-11-30 ENCOUNTER — Ambulatory Visit
Payer: Medicaid Other | Attending: Student in an Organized Health Care Education/Training Program | Admitting: Student in an Organized Health Care Education/Training Program

## 2018-11-30 ENCOUNTER — Encounter: Payer: Self-pay | Admitting: Student in an Organized Health Care Education/Training Program

## 2018-11-30 VITALS — BP 126/83 | HR 68 | Temp 98.4°F | Ht 71.0 in | Wt 240.0 lb

## 2018-11-30 DIAGNOSIS — M47812 Spondylosis without myelopathy or radiculopathy, cervical region: Secondary | ICD-10-CM

## 2018-11-30 DIAGNOSIS — G894 Chronic pain syndrome: Secondary | ICD-10-CM | POA: Insufficient documentation

## 2018-11-30 DIAGNOSIS — M4722 Other spondylosis with radiculopathy, cervical region: Secondary | ICD-10-CM | POA: Diagnosis present

## 2018-11-30 NOTE — Progress Notes (Signed)
Patient's Name: Mathew Robertson  MRN: [id number]  Referring Provider: [facility]  DOB: 06/17/1965  PCP: [facility]  DOS: 11/30/2018  Note by: Gillis Santa, MD  Service setting: Ambulatory outpatient  Attending: Gillis Santa, MD  Location: [facility] (AMB) Pain Management Facility  Specialty: Interventional Pain Management  Patient type: Established   Primary Reason(s) for Visit: Evaluation of chronic illnesses with exacerbation, or progression (Level of risk: moderate) CC: Other (generalizied)  HPI  Mathew Robertson is a 53 y.o. year old, male patient, who comes today for a follow-up evaluation. He has Chronic bilateral low back pain with bilateral sciatica; Lumbar degenerative disc disease; Chronic pain syndrome; Cervicalgia; Right cervical radiculopathy; Foraminal stenosis of cervical region (R); and Osteoarthritis of spine with radiculopathy, cervical region on their problem list. Mathew Robertson was last seen on Visit date not found. His primarily concern today is the Other (generalizied)  Pain Assessment: Location:   Generalized Radiating: All over my body Onset: More than a month ago Duration: Chronic pain Quality: Shooting, Stabbing Severity: 7 /10 (subjective, self-reported pain score)  Note: Reported level is compatible with observation.                         When using our objective Pain Scale, levels between 6 and 10/10 are said to belong in an emergency room, as it progressively worsens from a 6/10, described as severely limiting, requiring emergency care not usually available at an outpatient pain management facility. At a 6/10 level, communication becomes difficult and requires great effort. Assistance to reach the emergency department may be required. Facial flushing and profuse sweating along with potentially dangerous increases in heart rate and blood pressure will be evident. Effect on ADL: limits my daily actvities Timing: Constant Modifying factors:  nothing BP: 126/83  HR: 68  Further details on both, my assessment(s), as well as the proposed treatment plan, please see below.  Patient is a poor historian and is unclear why he is here.  He states that he is having diffuse whole body pain.  He is status post right C7-T1 cervical epidural steroid injection performed on 11/09/2017.  This helped out with his right cervical radicular pain.  He states that his radiating right arm pain is better.  He is not interested in repeating a cervical epidural steroid injection.  He is being seen by neurology for the management of his seizures, headaches and neuropathic pain.  I encouraged him to continue seeing them for his seizures and neuropathic pain.  Patient endorsed understanding.  I instructed the patient to follow-up with me only if he is interested in interventional therapy by way of cervical epidural steroid injection or diagnostic cervical facet medial branch nerve blocks which we have discussed in the past.  UDS:  Summary  Date Value Ref Range Status  12/22/2016 FINAL  Final    Comment:    ==================================================================== [facility] COMP DRUG ANALYSIS,UR ==================================================================== Test                             Result       Flag       Units Drug Present and Declared for Prescription Verification   Alprazolam                     74           EXPECTED   ng/mg creat   Alpha-hydroxyalprazolam  165          EXPECTED   ng/mg creat    Source of alprazolam is a scheduled prescription medication.    Alpha-hydroxyalprazolam is an expected metabolite of alprazolam.   Normorphine                    42           EXPECTED   ng/mg creat    Normorphine is an expected metabolite of morphine; morphine is an    expected metabolite of codeine.   Gabapentin                     PRESENT      EXPECTED   Oxcarbazepine MHD              PRESENT      EXPECTED    Oxcarbazepine MHD is  the active metabolite of oxcarbazepine and    eslicarbazepine.   Topiramate                     PRESENT      EXPECTED   Acetaminophen                  PRESENT      EXPECTED Drug Present not Declared for Prescription Verification   Norhydrocodone                 32           UNEXPECTED ng/mg creat    Norhydrocodone is an expected metabolite of hydrocodone. Drug Absent but Declared for Prescription Verification   Codeine                        Not Detected UNEXPECTED ng/mg creat   Nortriptyline                  Not Detected UNEXPECTED   Salicylate                     Not Detected UNEXPECTED    Aspirin, as indicated in the declared medication list, is not    always detected even when used as directed. ==================================================================== Test                      Result    Flag   Units      Ref Range   Creatinine              201              mg/dL      >=20 ==================================================================== Declared Medications:  The flagging and interpretation on this report are based on the  following declared medications.  Unexpected results may arise from  inaccuracies in the declared medications.  **Note: The testing scope of this panel includes these medications:  Alprazolam (Xanax)  Codeine (Tylenol 3)  Gabapentin (Neurontin)  Nortriptyline (Pamelor)  Oxcarbazepine (Trileptal)  Topiramate (Topamax)  **Note: The testing scope of this panel does not include small to  moderate amounts of these reported medications:  Acetaminophen (Tylenol 3)  Aspirin (Aspirin 81)  **Note: The testing scope of this panel does not include following  reported medications:  Albuterol (ProAir HFA)  Fluticasone (Advair)  Hydrochlorothiazide (Hydrodiuril)  Lacosamide (Vimpat)  Salmeterol (Advair) ==================================================================== For clinical consultation, please call [phone]. ====================================================================    Laboratory Chemistry Profile   Inflammation (CRP: Acute Phase) (ESR:  Chronic Phase) Lab Results  Component Value Date   CRP 3.7 12/22/2016   ESRSEDRATE 12 12/22/2016                         Rheumatology No results found for: RF, ANA, LABURIC, URICUR, LYMEIGGIGMAB, LYMEABIGMQN, HLAB27                      Renal Lab Results  Component Value Date   BUN 22 12/22/2016   CREATININE 1.12 12/22/2016   BCR 20 12/22/2016   GFRAA 87 12/22/2016   GFRNONAA 76 12/22/2016                             Hepatic Lab Results  Component Value Date   AST 16 12/22/2016   ALT 12 12/22/2016   ALBUMIN 4.8 12/22/2016   ALKPHOS 115 12/22/2016                        Electrolytes Lab Results  Component Value Date   NA 146 (H) 12/22/2016   K 3.8 12/22/2016   CL 104 12/22/2016   CALCIUM 9.1 12/22/2016   MG 2.2 12/22/2016                        Neuropathy Lab Results  Component Value Date   VITAMINB12 280 12/22/2016                        Bone Lab Results  Component Value Date   25OHVITD1 25 (L) 12/22/2016   25OHVITD2 <1.0 12/22/2016   25OHVITD3 25 12/22/2016                          Imaging Review  Cervical Imaging: Cervical MR wo contrast:  Results for orders placed during the hospital encounter of 10/29/17  MR CERVICAL SPINE WO CONTRAST   Narrative CLINICAL DATA:  Right side neck pain radiating into the right arm with numbness in the right fingers. Symptoms for 2 months.  EXAM: MRI CERVICAL SPINE WITHOUT CONTRAST  TECHNIQUE: Multiplanar, multisequence MR imaging of the cervical spine was performed. No intravenous contrast was administered.  COMPARISON:  Plain film cervical spine 08/27/2017.  FINDINGS: Alignment: No listhesis. Mild reversal lordosis from C5-T1 is noted.  Vertebrae: No fracture or worrisome lesion. 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  Cord: Normal signal throughout.  Posterior Fossa, vertebral arteries, paraspinal tissues: Negative.  Disc levels:  C2-3: No disc bulge or protrusion. Right worse than left facet arthropathy. There is moderate to moderately severe right foraminal narrowing. The central canal and left foramen are open.  C3-4: Mild posterior bony ridging and uncovertebral disease. Mild to moderate facet degenerative change is worse on the left. The ventral thecal sac is slightly narrowed but the central canal is open. Severe left and moderate right foraminal narrowing is seen.  C4-5: The patient has a central disc protrusion deforming the ventral cord. There is some uncovertebral spurring and mild facet arthropathy. Moderate to moderately severe bilateral foraminal narrowing is present.  C5-6: Disc bulge with a superimposed central protrusion. Disc indents the ventral cord in conjunction with reversal of lordosis. Mild to moderate bilateral foraminal narrowing is seen.  C6-7: There is a disc bulge and left much worse  than right uncovertebral disease. There is mild flattening of the scratch the cord is somewhat flattened due to disc and reversal lordosis. Moderately severe to severe foraminal narrowing is worse on the left.  C7-T1: There is a shallow disc bulge. Bilateral uncovertebral disease is worse on the right. The central canal is open. Severe right and moderately severe left foraminal narrowing is present.  IMPRESSION: Reversal of cervical lordosis from C5-T1.  Central disc protrusion at C4-5 deforms the ventral cord. Moderate to moderately severe bilateral foraminal narrowing is present at this level.  Disc indents the ventral cord at C5-6 in conjunction with reversal of lordosis. Mild to moderate bilateral foraminal narrowing is present at this level.  The cord is mildly flattened by disc and reversal of lordosis at C6-7. Moderately severe to severe  foraminal narrowing is worse on the right.  Severe left and moderate right foraminal narrowing at C3-4 where the ventral thecal sac is slightly narrowed but not effaced.  Severe right and moderately severe left foraminal narrowing C7-T1.  Moderate to moderately severe right foraminal narrowing C2-3.   Electronically Signed   By: Inge Rise M.D.   On: 10/30/2017 10:40    Cervical DG Bending/F/E views:  Results for orders placed during the hospital encounter of 08/27/17  DG Cervical Spine With Flex & Extend   Narrative CLINICAL DATA:  Chronic neck pain radiating to right shoulder  EXAM: CERVICAL SPINE COMPLETE WITH FLEXION AND EXTENSION VIEWS  COMPARISON:  None.  FINDINGS: Moderate to advanced degenerative disc disease at C6-7 with disc space narrowing and spurring anteriorly. No subluxation. Moderate multilevel bilateral neural foraminal narrowing due to degenerative facet disease and uncovertebral spurring. No fracture. Prevertebral soft tissues are normal.  IMPRESSION: Degenerative disc disease in the lower cervical spine. Diffuse degenerative facet disease bilaterally with diffuse multilevel bilateral neural foraminal narrowing. No acute bony abnormality.   Electronically Signed   By: Rolm Baptise M.D.   On: 08/27/2017 10:57    Shoulder-R DG:  Results for orders placed during the hospital encounter of 08/27/17  DG Shoulder Right   Narrative CLINICAL DATA:  Chronic pain.  EXAM: RIGHT SHOULDER - 2+ VIEW  COMPARISON:  No prior.  FINDINGS: No acute bony or joint abnormality identified. No evidence of fracture or dislocation.  IMPRESSION: No acute abnormality.   Electronically Signed   By: Marcello Moores  Register   On: 08/27/2017 10:58     Complexity Note: Imaging results reviewed. Results shared with Mr. Rout, using Layman's terms.                         Meds   Current Outpatient Medications:  .  ADVAIR DISKUS 250-50 MCG/DOSE AEPB, Take 1 puff  by mouth as needed., Disp: , Rfl: 0 .  cholecalciferol (VITAMIN D3) 25 MCG (1000 UT) tablet, Take 1,000 Units by mouth daily., Disp: , Rfl:  .  clonazePAM (KLONOPIN) 1 MG tablet, Take 1 mg by mouth at bedtime., Disp: , Rfl: 0 .  Cyanocobalamin (B-12 COMPLIANCE INJECTION IJ), Inject as directed every 30 (thirty) days., Disp: , Rfl:  .  escitalopram (LEXAPRO) 5 MG tablet, Take 5 mg by mouth at bedtime., Disp: , Rfl: 0 .  Fremanezumab-vfrm (AJOVY) 225 MG/1.5ML SOAJ, Inject into the skin every 30 (thirty) days., Disp: , Rfl:  .  gabapentin (NEURONTIN) 600 MG tablet, Take 600 mg by mouth 4 (four) times daily., Disp: , Rfl: 1 .  nortriptyline (PAMELOR) 25 MG capsule, Take 2  capsules by mouth at bedtime as needed., Disp: , Rfl: 0 .  omeprazole (PRILOSEC) 40 MG capsule, TK ONE C PO QD, Disp: , Rfl: 0 .  Oxcarbazepine (TRILEPTAL) 300 MG tablet, Take 300 mg by mouth 2 (two) times daily., Disp: , Rfl:  .  PROAIR HFA 108 (90 Base) MCG/ACT inhaler, Take 2 puffs by mouth 4 (four) times daily as needed., Disp: , Rfl: 1 .  topiramate (TOPAMAX) 50 MG tablet, Take 100 mg by mouth 2 (two) times daily. 1 tablet in the am and 2 tablets nightly., Disp: , Rfl: 0 .  VIMPAT 200 MG TABS tablet, Take 1 tablet by mouth 2 (two) times daily., Disp: , Rfl: 0 .  acetaminophen-codeine (TYLENOL #3) 300-30 MG tablet, Take 1 tablet by mouth every 8 (eight) hours as needed., Disp: , Rfl: 0 .  ALPRAZolam (XANAX) 1 MG tablet, Take 1 tablet by mouth 3 (three) times daily., Disp: , Rfl: 0 .  ASPIRIN LOW DOSE 81 MG EC tablet, Take 1 tablet by mouth daily., Disp: , Rfl: 3 .  capsaicin (ZOSTRIX) 0.025 % cream, Apply topically 2 (two) times daily., Disp: , Rfl:  .  hydrochlorothiazide (HYDRODIURIL) 25 MG tablet, Take 1 tablet by mouth daily., Disp: , Rfl: 2  ROS  Constitutional: Denies any fever or chills Gastrointestinal: No reported hemesis, hematochezia, vomiting, or acute GI distress Musculoskeletal: Denies any acute onset joint  swelling, redness, loss of ROM, or weakness Neurological: No reported episodes of acute onset apraxia, aphasia, dysarthria, agnosia, amnesia, paralysis, loss of coordination, or loss of consciousness  Allergies  Mr. Brookover is allergic to dilantin [phenytoin] and sulfa antibiotics.  [place]  Drug: Mr. Novacek  reports no history of drug use. Alcohol:  reports previous alcohol use. Tobacco:  reports that he has been smoking cigarettes. He has a 5.00 pack-year smoking history. He has never used smokeless tobacco. Medical:  has a past medical history of Asthma, Chronic pain disorder, Headache, Hypertension, and Seizures ([place]). Surgical: Mr. Corman  has a past surgical history that includes Back surgery; cosmetic eye surgery; Foot surgery; Nose surgery; Vasectomy; Esophagogastroduodenoscopy (egd) with propofol (N/A, 06/05/2017); and Colonoscopy with propofol (N/A, 06/05/2017). Family: family history includes Arthritis in his mother; Cancer in his mother; Mental illness in his mother.  Constitutional Exam  General appearance: alert, cooperative, slowed mentation and confused  Vitals:   11/30/18 1313  BP: 126/83  Pulse: 68  Temp: 98.4 F (36.9 C)  SpO2: 98%  Weight: 240 lb (108.9 kg)  Height: _0  (1.803 m)   BMI Assessment: Estimated body mass index is 33.47 kg/m as calculated from the following:   Height as of this encounter: _1  (1.803 m).   Weight as of this encounter: 240 lb (108.9 kg).  BMI interpretation table: BMI level Category Range association with higher incidence of chronic pain  <18 kg/m2 Underweight   18.5-24.9 kg/m2 Ideal body weight   25-29.9 kg/m2 Overweight Increased incidence by 20%  30-34.9 kg/m2 Obese (Class I) Increased incidence by 68%  35-39.9 kg/m2 Severe obesity (Class II) Increased incidence by 136%  >40 kg/m2 Extreme obesity (Class III) Increased incidence by 254%   Patient's current BMI Ideal Body weight  Body mass index is 33.47 kg/m. Ideal body weight:  75.3 kg (166 lb 0.1 oz) Adjusted ideal body weight: 88.7 kg (195 lb 9.7 oz)   BMI Readings from Last 4 Encounters:  11/30/18 33.47 kg/m  01/11/18 27.78 kg/m  12/14/17 28.87 kg/m  11/09/17 28.74 kg/m  Wt Readings from Last 4 Encounters:  11/30/18 240 lb (108.9 kg)  01/11/18 202 lb (91.6 kg)  12/14/17 207 lb (93.9 kg)  11/09/17 209 lb (94.8 kg)  Psych/Mental status: Alert, oriented x 3 (person, place, & time)       Eyes: PERLA Respiratory: No evidence of acute respiratory distress  Cervical Spine Area Exam  Skin & Axial Inspection: No masses, redness, edema, swelling, or associated skin lesions Alignment: Symmetrical Functional ROM: Decreased ROM, bilaterally Stability: No instability detected Muscle Tone/Strength: Functionally intact. No obvious neuro-muscular anomalies detected. Sensory (Neurological): Musculoskeletal pain pattern Palpation: No palpable anomalies              Upper Extremity (UE) Exam    Side: Right upper extremity  Side: Left upper extremity  Skin & Extremity Inspection: Skin color, temperature, and hair growth are WNL. No peripheral edema or cyanosis. No masses, redness, swelling, asymmetry, or associated skin lesions. No contractures.  Skin & Extremity Inspection: Skin color, temperature, and hair growth are WNL. No peripheral edema or cyanosis. No masses, redness, swelling, asymmetry, or associated skin lesions. No contractures.  Functional ROM: Decreased ROM for all joints of upper extremity  Functional ROM: Decreased ROM for all joints of upper extremity  Muscle Tone/Strength: Functionally intact. No obvious neuro-muscular anomalies detected.  Muscle Tone/Strength: Functionally intact. No obvious neuro-muscular anomalies detected.  Sensory (Neurological): Unimpaired          Sensory (Neurological): Unimpaired          Palpation: No palpable anomalies              Palpation: No palpable anomalies              Provocative Test(s):  Phalen's test:  deferred Tinel's test: deferred Apley's scratch test (touch opposite shoulder):  Action 1 (Across chest): Decreased ROM Action 2 (Overhead): Decreased ROM Action 3 (LB reach): Decreased ROM   Provocative Test(s):  Phalen's test: deferred Tinel's test: deferred Apley's scratch test (touch opposite shoulder):  Action 1 (Across chest): Decreased ROM Action 2 (Overhead): Decreased ROM Action 3 (LB reach): Decreased ROM    Thoracic Spine Area Exam  Skin & Axial Inspection: No masses, redness, or swelling Alignment: Symmetrical Functional ROM: Unrestricted ROM Stability: No instability detected Muscle Tone/Strength: Functionally intact. No obvious neuro-muscular anomalies detected. Sensory (Neurological): Unimpaired Muscle strength & Tone: No palpable anomalies  Lumbar Spine Area Exam  Skin & Axial Inspection: No masses, redness, or swelling Alignment: Symmetrical Functional ROM: Decreased ROM       Stability: No instability detected Muscle Tone/Strength: Functionally intact. No obvious neuro-muscular anomalies detected. Sensory (Neurological): Musculoskeletal pain pattern   Gait & Posture Assessment  Ambulation: Patient came in today in a wheel chair Gait: Limited. Using assistive device to ambulate Posture: Difficulty standing up straight, due to pain   Lower Extremity Exam    Side: Right lower extremity  Side: Left lower extremity  Stability: No instability observed          Stability: No instability observed          Skin & Extremity Inspection: Skin color, temperature, and hair growth are WNL. No peripheral edema or cyanosis. No masses, redness, swelling, asymmetry, or associated skin lesions. No contractures.  Skin & Extremity Inspection: Skin color, temperature, and hair growth are WNL. No peripheral edema or cyanosis. No masses, redness, swelling, asymmetry, or associated skin lesions. No contractures.  Functional ROM: Pain restricted ROM for all joints of the lower  extremity  Functional ROM: Pain restricted ROM for all joints of the lower extremity          Muscle Tone/Strength: Functionally intact. No obvious neuro-muscular anomalies detected.  Muscle Tone/Strength: Functionally intact. No obvious neuro-muscular anomalies detected.  Sensory (Neurological): Unimpaired        Sensory (Neurological): Unimpaired        DTR: Patellar: deferred today Achilles: deferred today Plantar: deferred today  DTR: Patellar: deferred today Achilles: deferred today Plantar: deferred today  Palpation: No palpable anomalies  Palpation: No palpable anomalies   Assessment   Status Diagnosis  Controlled Controlled Controlled 1. Cervical spondylosis   2. Osteoarthritis of spine with radiculopathy, cervical region   3. Cervical facet syndrome   4. Chronic pain syndrome      Continue care with neurology.  Follow-up as needed for repeat right cervical epidural steroid injection or diagnostic cervical facet medial branch nerve blocks as we have discussed in the past.  Do not recommend chronic opioid therapy for his condition.  We discussed components of chronic pain management including the biopsychosocial aspects of chronic pain.  It appears that neurology is managing many of his neuropathic medications and managing his migraines.  I encouraged the patient to continue care with them.  He is welcome to see me on an as-needed basis to discuss interventional therapies.  Planned follow-up:   Recent Visits No visits were found meeting these conditions.  Showing recent visits within past 90 days and meeting all other requirements   Today's Visits Date Type Provider Dept  11/30/18 Office Visit Gillis Santa, MD [facility]  Showing today's visits and meeting all other requirements   Future Appointments No visits were found meeting these conditions.  Showing future appointments within next 90 days and meeting all other requirements   Primary Care  Physician: [facility] [place] Location: [facility] Note by: Gillis Santa, MD Date: 11/30/2018; Time: 2:15 PM  Note: This dictation was prepared with Dragon dictation. Any transcriptional errors that may result from this process are unintentional.

## 2019-02-14 ENCOUNTER — Telehealth: Payer: Self-pay | Admitting: *Deleted

## 2019-02-14 ENCOUNTER — Ambulatory Visit: Payer: Medicaid Other | Admitting: Podiatry

## 2019-02-14 ENCOUNTER — Other Ambulatory Visit: Payer: Self-pay

## 2019-02-14 ENCOUNTER — Encounter: Payer: Self-pay | Admitting: Podiatry

## 2019-02-14 DIAGNOSIS — B351 Tinea unguium: Secondary | ICD-10-CM | POA: Diagnosis not present

## 2019-02-14 DIAGNOSIS — M79674 Pain in right toe(s): Secondary | ICD-10-CM

## 2019-02-14 DIAGNOSIS — M79675 Pain in left toe(s): Secondary | ICD-10-CM

## 2019-02-14 NOTE — Progress Notes (Addendum)
Complaint:  Visit Type: Patient presents  to my office for  preventative foot care services. Complaint: Patient states" my nails have grown long and thick and become painful to walk and wear shoes".  He presents to the office with male helper. Patient is taking gabapentin.  The patient presents for preventative foot care services.  Podiatric Exam: Vascular: dorsalis pedis and posterior tibial pulses are palpable bilateral. Capillary return is immediate. Temperature gradient is WNL. Skin turgor WNL  Sensorium: Normal Semmes Weinstein monofilament test. Normal tactile sensation bilaterally. Nail Exam: Pt has thick disfigured discolored nails with subungual debris noted bilateral entire nail hallux through fifth toenails Ulcer Exam: There is no evidence of ulcer or pre-ulcerative changes or infection. Orthopedic Exam: Muscle tone and strength are WNL. No limitations in general ROM. No crepitus or effusions noted. Foot type and digits show no abnormalities. Hallux limitus 1st MPJ  B/L.  Hammer toes  B/L. Skin: No Porokeratosis. No infection or ulcers.  Heel callus right foot.  Diagnosis:  Onychomycosis, , Pain in right toe, pain in left toes  Treatment & Plan Procedures and Treatment: Consent by patient was obtained for treatment procedures.   Debridement of mycotic and hypertrophic toenails, 1 through 5 bilateral and clearing of subungual debris. No ulceration, no infection noted.  Return Visit-Office Procedure: Patient instructed to return to the office for a follow up visit 3 months for continued evaluation and treatment.    Gardiner Barefoot DPM

## 2019-02-14 NOTE — Telephone Encounter (Signed)
Pt asked if he needed to keep his appt today. Left message informing pt that he would benefit from the appt to have his feet evaluated.

## 2019-06-20 ENCOUNTER — Ambulatory Visit: Payer: Medicaid Other | Admitting: Podiatry

## 2019-06-29 ENCOUNTER — Telehealth: Payer: Self-pay

## 2019-06-29 NOTE — Telephone Encounter (Signed)
AUTHORIZATION for TRAMADOL 50MG  Tablets approved from 06/27/2019 through 12/24/2019 [facility]

## 2019-07-13 ENCOUNTER — Telehealth: Payer: Self-pay

## 2019-07-13 NOTE — Telephone Encounter (Signed)
Please disregard prior note insurance sent PA to the office PT is no longer seen here.

## 2019-07-22 ENCOUNTER — Other Ambulatory Visit: Payer: Self-pay | Admitting: Physician Assistant

## 2019-07-22 DIAGNOSIS — M25522 Pain in left elbow: Secondary | ICD-10-CM

## 2019-07-22 DIAGNOSIS — M25512 Pain in left shoulder: Secondary | ICD-10-CM

## 2019-07-22 DIAGNOSIS — W19XXXA Unspecified fall, initial encounter: Secondary | ICD-10-CM

## 2019-10-27 ENCOUNTER — Ambulatory Visit: Payer: Medicaid Other | Admitting: Podiatry

## 2019-10-27 ENCOUNTER — Encounter: Payer: Self-pay | Admitting: Podiatry

## 2019-10-27 ENCOUNTER — Other Ambulatory Visit: Payer: Self-pay

## 2019-10-27 DIAGNOSIS — M79675 Pain in left toe(s): Secondary | ICD-10-CM

## 2019-10-27 DIAGNOSIS — M79674 Pain in right toe(s): Secondary | ICD-10-CM

## 2019-10-27 DIAGNOSIS — B351 Tinea unguium: Secondary | ICD-10-CM | POA: Diagnosis not present

## 2019-10-27 NOTE — Progress Notes (Signed)
Complaint:  Visit Type: Patient presents  to my office for  preventative foot care services. Complaint: Patient states" my nails have grown long and thick and become painful to walk and wear shoes".  He presents to the office with male helper. Patient is taking gabapentin.  The patient presents for preventative foot care services. Patient has not been seen for months.  Podiatric Exam: Vascular: dorsalis pedis and posterior tibial pulses are palpable bilateral. Capillary return is immediate. Temperature gradient is WNL. Skin turgor WNL  Sensorium: Normal Semmes Weinstein monofilament test. Normal tactile sensation bilaterally. Nail Exam: Pt has thick disfigured discolored nails with subungual debris noted bilateral entire nail hallux through fifth toenails Ulcer Exam: There is no evidence of ulcer or pre-ulcerative changes or infection. Orthopedic Exam: Muscle tone and strength are WNL. No limitations in general ROM. No crepitus or effusions noted. Foot type and digits show no abnormalities. Hallux limitus 1st MPJ  B/L.  Hammer toes  B/L. Skin: No Porokeratosis. No infection or ulcers.    Diagnosis:  Onychomycosis, , Pain in right toe, pain in left toes  Treatment & Plan Procedures and Treatment: Consent by patient was obtained for treatment procedures.   Debridement of mycotic and hypertrophic toenails, 1 through 5 bilateral and clearing of subungual debris. No ulceration, no infection noted.  Return Visit-Office Procedure: Patient instructed to return to the office for a follow up visit 3 months for continued evaluation and treatment.    Gardiner Barefoot DPM

## 2020-01-15 IMAGING — MR MR CERVICAL SPINE W/O CM
5 series · 36 of 48 positions shown · non-contrast
Comparison: Plain film cervical spine 08/27/2017.

CLINICAL DATA: Right side neck pain radiating into the right arm
with numbness in the right fingers. Symptoms for 2 months.

EXAM:
MRI CERVICAL SPINE WITHOUT CONTRAST
TECHNIQUE: Multiplanar, multisequence MR imaging of the cervical spine was
performed. No intravenous contrast was administered.

[Series 5: T2 · sagittal · 3.0mm · 0.62mm/px · 7 of 15 slices shown (1 of 2)]
[im 1/15]
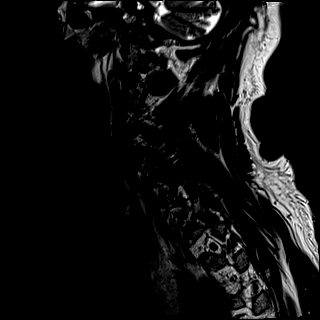
[im 3/15]
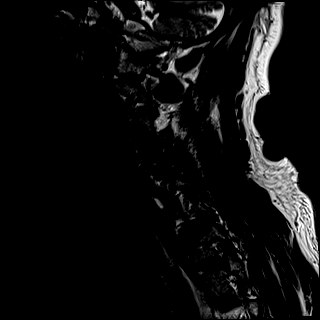
[im 5/15]
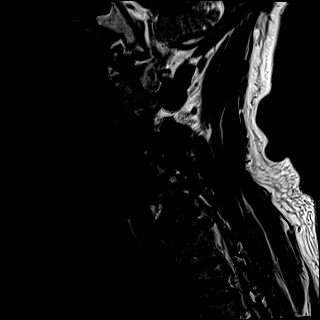
[im 8/15]
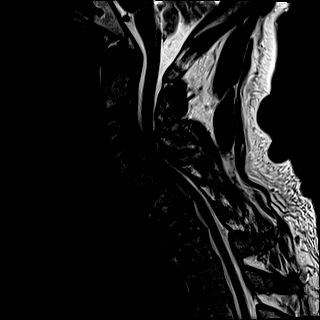
[im 10/15]
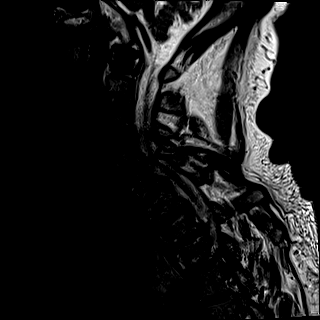
[im 12/15]
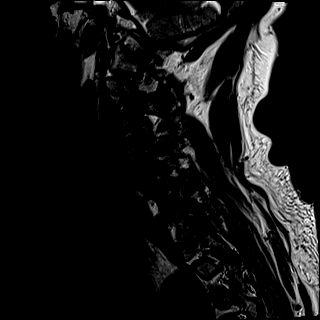
[im 15/15]
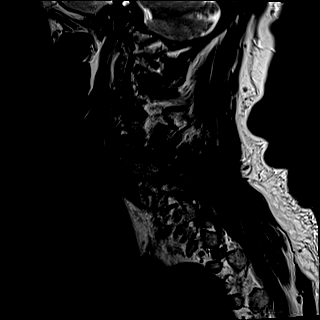

[Series 6: FLAIR · sagittal · 3.0mm · 0.78mm/px · 7 of 15 slices shown]
[im 1/15]
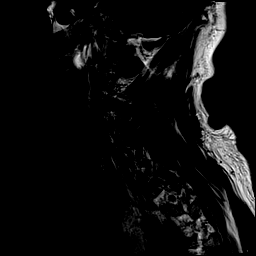
[im 3/15]
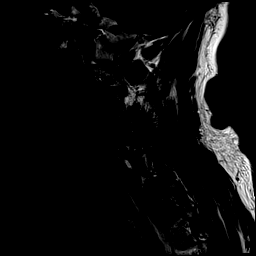
[im 5/15]
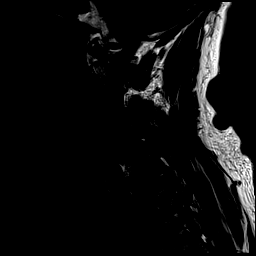
[im 8/15]
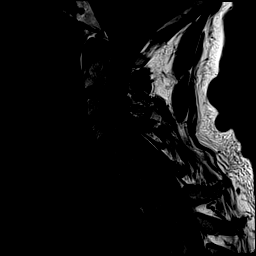
[im 10/15]
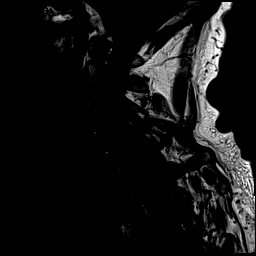
[im 12/15]
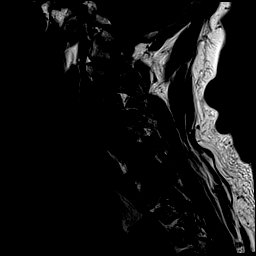
[im 15/15]
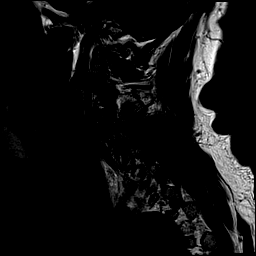

[Series 7: STIR · sagittal · 3.0mm · 0.62mm/px · 6 of 15 slices shown]
[im 1/15]
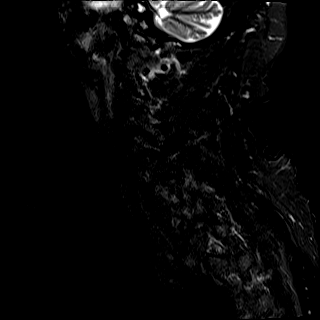
[im 3/15]
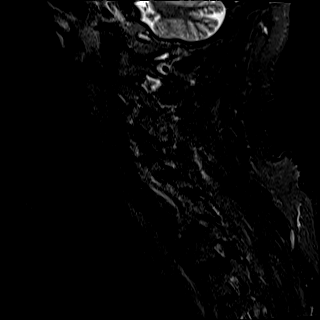
[im 6/15]
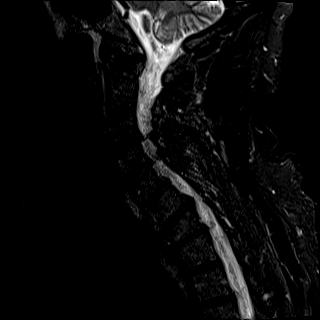
[im 9/15]
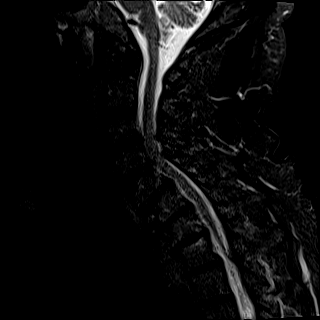
[im 12/15]
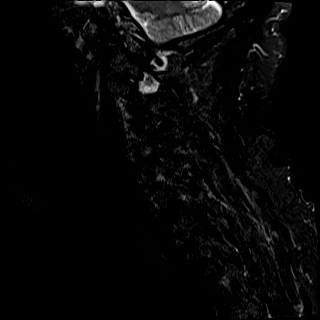
[im 15/15]
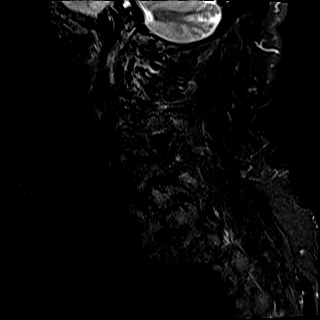

[Series 8: T2 · axial · 3.0mm · 0.70mm/px · z∈[-75,+21]mm · 8 of 33 slices shown (2 of 2)]
[im 1/33]
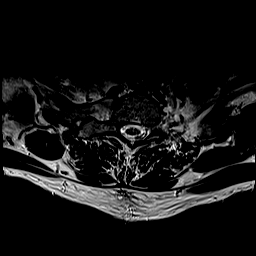
[im 5/33]
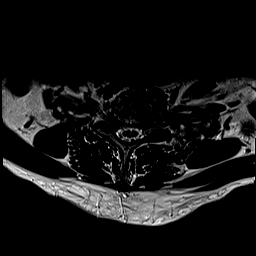
[im 10/33]
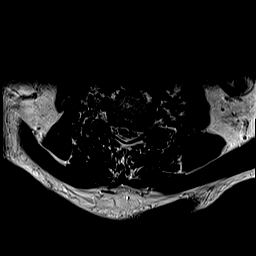
[im 15/33]
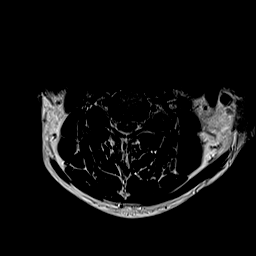
[im 18/33]
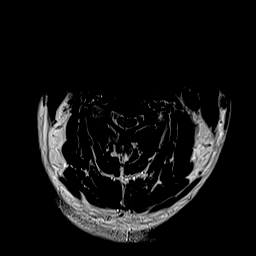
[im 23/33]
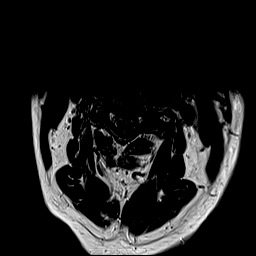
[im 28/33]
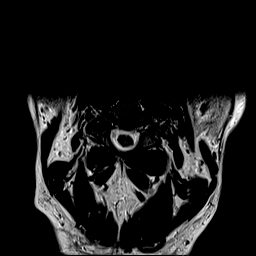
[im 33/33]
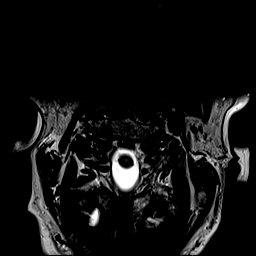

[Series 9: ax mpgr · axial · 3.0mm · 0.35mm/px · z∈[-75,+21]mm · 8 of 33 slices shown]
[im 1/33]
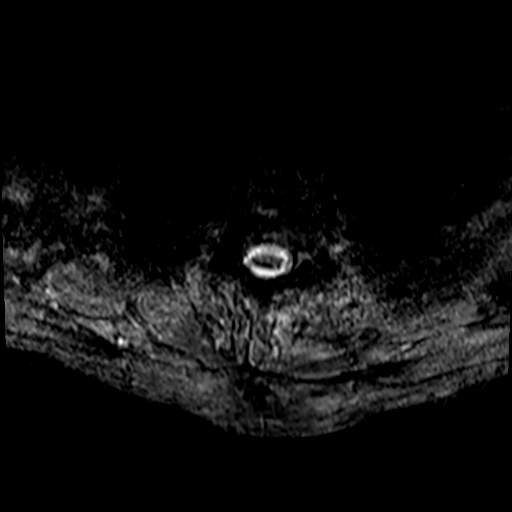
[im 5/33]
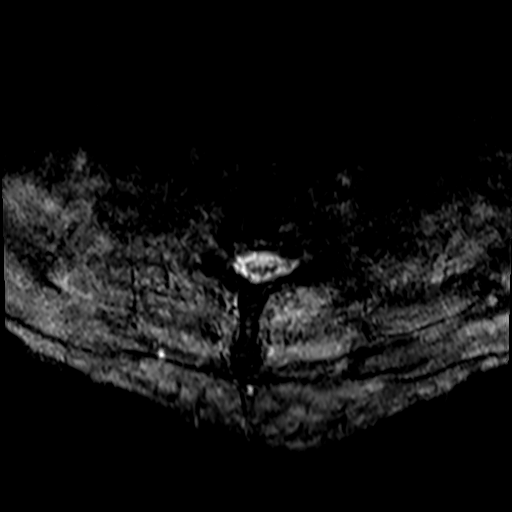
[im 10/33]
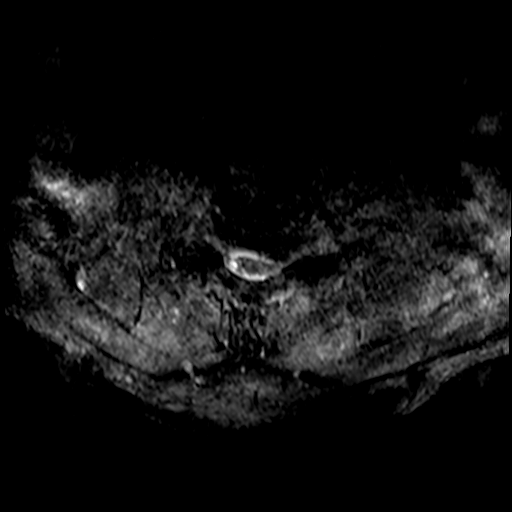
[im 15/33]
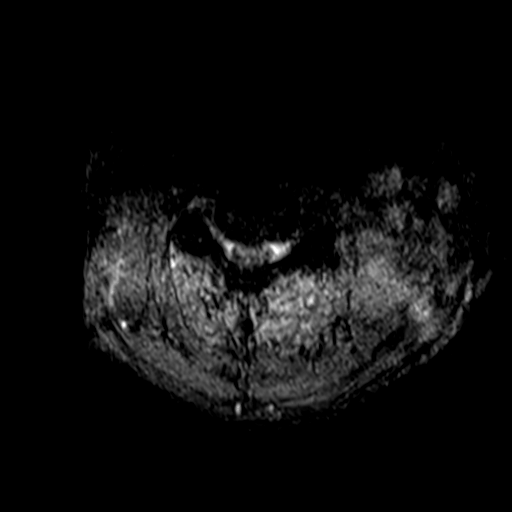
[im 18/33]
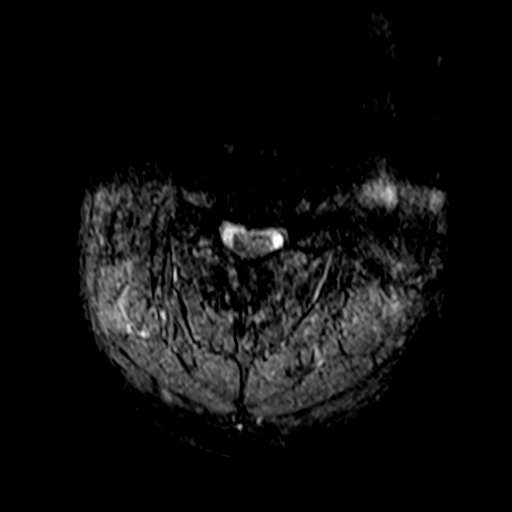
[im 23/33]
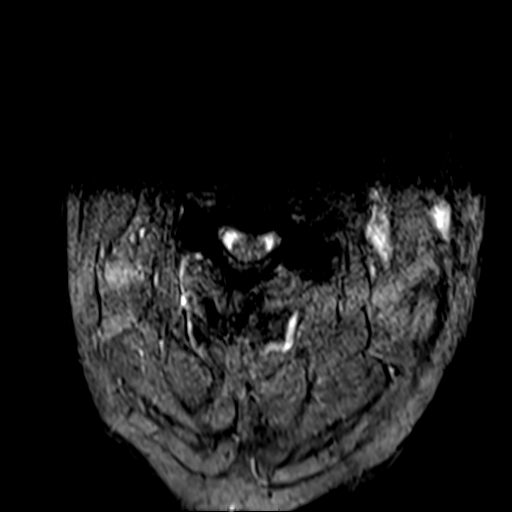
[im 28/33]
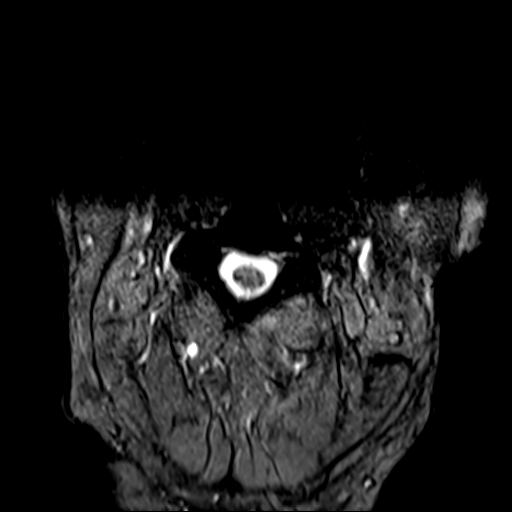
[im 33/33]
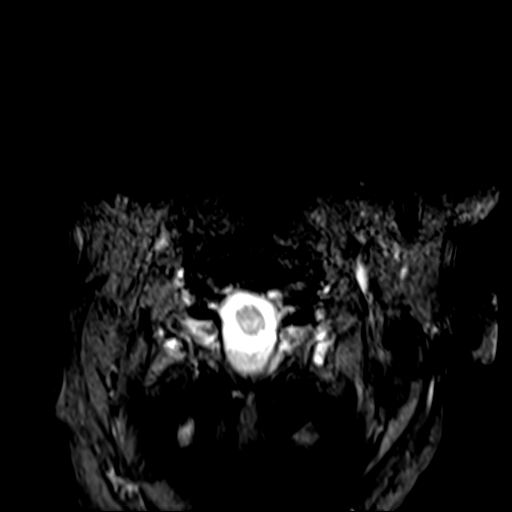

[36 of 48 positions shown; findings below may reference images not displayed]

FINDINGS: Alignment: No listhesis. Mild reversal lordosis from C5-T1 is noted.

Vertebrae: No fracture or worrisome lesion. The patient has a
congenitally narrow central canal from C3-C7 due to short pedicle
length.

Cord: Normal signal throughout.

Posterior Fossa, vertebral arteries, paraspinal tissues: Negative.

Disc levels:

C2-3: No disc bulge or protrusion. Right worse than left facet
arthropathy. There is moderate to moderately severe right foraminal
narrowing. The central canal and left foramen are open.

C3-4: Mild posterior bony ridging and uncovertebral disease. Mild to
moderate facet degenerative change is worse on the left. The ventral
thecal sac is slightly narrowed but the central canal is open.
Severe left and moderate right foraminal narrowing is seen.

C4-5: The patient has a central disc protrusion deforming the
ventral cord. There is some uncovertebral spurring and mild facet
arthropathy. Moderate to moderately severe bilateral foraminal
narrowing is present.

C5-6: Disc bulge with a superimposed central protrusion. Disc
indents the ventral cord in conjunction with reversal of lordosis.
Mild to moderate bilateral foraminal narrowing is seen.

C6-7: There is a disc bulge and left much worse than right
uncovertebral disease. There is mild flattening of the scratch the
cord is somewhat flattened due to disc and reversal lordosis.
Moderately severe to severe foraminal narrowing is worse on the
left.

C7-T1: There is a shallow disc bulge. Bilateral uncovertebral
disease is worse on the right. The central canal is open. Severe
right and moderately severe left foraminal narrowing is present.
IMPRESSION: Reversal of cervical lordosis from C5-T1.

Central disc protrusion at C4-5 deforms the ventral cord. Moderate
to moderately severe bilateral foraminal narrowing is present at
this level.

Disc indents the ventral cord at C5-6 in conjunction with reversal
of lordosis. Mild to moderate bilateral foraminal narrowing is
present at this level.

The cord is mildly flattened by disc and reversal of lordosis at
C6-7. Moderately severe to severe foraminal narrowing is worse on
the right.

Severe left and moderate right foraminal narrowing at C3-4 where the
ventral thecal sac is slightly narrowed but not effaced.

Severe right and moderately severe left foraminal narrowing C7-T1.

Moderate to moderately severe right foraminal narrowing C2-3.

## 2020-02-27 ENCOUNTER — Ambulatory Visit: Payer: Medicaid Other | Admitting: Podiatry

## 2020-02-27 ENCOUNTER — Encounter: Payer: Self-pay | Admitting: Podiatry

## 2020-02-27 ENCOUNTER — Other Ambulatory Visit: Payer: Self-pay

## 2020-02-27 DIAGNOSIS — M79674 Pain in right toe(s): Secondary | ICD-10-CM | POA: Diagnosis not present

## 2020-02-27 DIAGNOSIS — M79675 Pain in left toe(s): Secondary | ICD-10-CM | POA: Diagnosis not present

## 2020-02-27 DIAGNOSIS — B351 Tinea unguium: Secondary | ICD-10-CM

## 2020-02-27 NOTE — Progress Notes (Signed)
Complaint:  Visit Type: Patient presents  to my office for  preventative foot care services. Complaint: Patient states" my nails have grown long and thick and become painful to walk and wear shoes". . Patient is taking gabapentin.  The patient presents for preventative foot care services.   Podiatric Exam: Vascular: dorsalis pedis and posterior tibial pulses are palpable bilateral. Capillary return is immediate. Temperature gradient is WNL. Skin turgor WNL  Sensorium: Normal Semmes Weinstein monofilament test. Normal tactile sensation bilaterally. Nail Exam: Pt has thick disfigured discolored nails with subungual debris noted bilateral entire nail hallux through fifth toenails Ulcer Exam: There is no evidence of ulcer or pre-ulcerative changes or infection. Orthopedic Exam: Muscle tone and strength are WNL. No limitations in general ROM. No crepitus or effusions noted. Foot type and digits show no abnormalities. Hallux limitus 1st MPJ  B/L.  Hammer toes  B/L. Skin: No Porokeratosis. No infection or ulcers.  Diffuse plantar tyloma  B/L.    Diagnosis:  Onychomycosis, , Pain in right toe, pain in left toes  Treatment & Plan Procedures and Treatment: Consent by patient was obtained for treatment procedures.   Debridement of mycotic and hypertrophic toenails, 1 through 5 bilateral and clearing of subungual debris. No ulceration, no infection noted.  Return Visit-Office Procedure: Patient instructed to return to the office for a follow up visit 3 months for continued evaluation and treatment.    Gardiner Barefoot DPM

## 2020-05-01 ENCOUNTER — Encounter: Payer: Self-pay | Admitting: Podiatry

## 2020-05-28 ENCOUNTER — Ambulatory Visit: Payer: Medicaid Other | Admitting: Podiatry

## 2020-06-07 ENCOUNTER — Ambulatory Visit: Payer: Medicaid Other | Admitting: Podiatry

## 2020-10-29 ENCOUNTER — Other Ambulatory Visit: Payer: Self-pay | Admitting: Physician Assistant

## 2020-10-29 DIAGNOSIS — Z87891 Personal history of nicotine dependence: Secondary | ICD-10-CM

## 2020-10-29 DIAGNOSIS — J4530 Mild persistent asthma, uncomplicated: Secondary | ICD-10-CM

## 2022-04-07 ENCOUNTER — Ambulatory Visit: Payer: Medicaid Other | Admitting: Family

## 2022-04-07 ENCOUNTER — Ambulatory Visit
Admission: RE | Admit: 2022-04-07 | Discharge: 2022-04-07 | Disposition: A | Discharge status: Home / Self Care | Payer: Medicaid Other | Attending: Gastroenterology | Admitting: Gastroenterology

## 2022-04-07 ENCOUNTER — Encounter
Admission: RE | Disposition: A | Discharge status: Home / Self Care | Payer: Self-pay | Source: Home / Self Care | Attending: Gastroenterology

## 2022-04-07 ENCOUNTER — Other Ambulatory Visit: Payer: Self-pay

## 2022-04-07 ENCOUNTER — Encounter: Payer: Self-pay | Admitting: *Deleted

## 2022-04-07 DIAGNOSIS — Z7951 Long term (current) use of inhaled steroids: Secondary | ICD-10-CM | POA: Insufficient documentation

## 2022-04-07 DIAGNOSIS — D123 Benign neoplasm of transverse colon: Secondary | ICD-10-CM | POA: Diagnosis not present

## 2022-04-07 DIAGNOSIS — Z86010 Personal history of colonic polyps: Secondary | ICD-10-CM | POA: Diagnosis not present

## 2022-04-07 DIAGNOSIS — Z6839 Body mass index (BMI) 39.0-39.9, adult: Secondary | ICD-10-CM | POA: Insufficient documentation

## 2022-04-07 DIAGNOSIS — J45909 Unspecified asthma, uncomplicated: Secondary | ICD-10-CM | POA: Insufficient documentation

## 2022-04-07 DIAGNOSIS — G40909 Epilepsy, unspecified, not intractable, without status epilepticus: Secondary | ICD-10-CM | POA: Insufficient documentation

## 2022-04-07 DIAGNOSIS — D125 Benign neoplasm of sigmoid colon: Secondary | ICD-10-CM | POA: Diagnosis not present

## 2022-04-07 DIAGNOSIS — Z1211 Encounter for screening for malignant neoplasm of colon: Secondary | ICD-10-CM | POA: Insufficient documentation

## 2022-04-07 DIAGNOSIS — K640 First degree hemorrhoids: Secondary | ICD-10-CM | POA: Diagnosis not present

## 2022-04-07 DIAGNOSIS — Z79899 Other long term (current) drug therapy: Secondary | ICD-10-CM | POA: Diagnosis not present

## 2022-04-07 DIAGNOSIS — G8929 Other chronic pain: Secondary | ICD-10-CM | POA: Diagnosis not present

## 2022-04-07 DIAGNOSIS — F17200 Nicotine dependence, unspecified, uncomplicated: Secondary | ICD-10-CM | POA: Diagnosis not present

## 2022-04-07 DIAGNOSIS — I10 Essential (primary) hypertension: Secondary | ICD-10-CM | POA: Diagnosis not present

## 2022-04-07 HISTORY — PX: COLONOSCOPY WITH PROPOFOL: SHX5780

## 2022-04-07 SURGERY — COLONOSCOPY WITH PROPOFOL
Anesthesia: General

## 2022-04-07 MED ORDER — PROPOFOL 500 MG/50ML IV EMUL
INTRAVENOUS | Status: DC | PRN
  Administered 2022-04-07: 110 mg via INTRAVENOUS
  Administered 2022-04-07: 100 ug/kg/min via INTRAVENOUS

## 2022-04-07 MED ORDER — SODIUM CHLORIDE 0.9 % IV SOLN: INTRAVENOUS | Status: DC

## 2022-04-07 NOTE — H&P (Signed)
Outpatient short stay form Pre-procedure 04/07/2022  Lesly Rubenstein, MD  Primary Physician: [facility]  Reason for visit:  Surveillance colonoscopy  History of present illness:    57 y/o gentleman with history of hypertension and seizure disorder here for surveillance colonoscopy with last colonoscopy in 2019 with small polyp. No blood thinners. No family history of GI malignancies. No significant abdominal surgeries.    Current Facility-Administered Medications:    0.9 %  sodium chloride infusion, , Intravenous, Continuous, Birdell Frasier, Hilton Cork, MD, Last Rate: 20 mL/hr at 04/07/22 1232, New Bag at 04/07/22 1232  Medications Prior to Admission  Medication Sig Dispense Refill Last Dose   amLODipine (NORVASC) 10 MG tablet Take 10 mg by mouth daily.   04/07/2022   cholecalciferol (VITAMIN D3) 25 MCG (1000 UT) tablet Take 1,000 Units by mouth daily.   Past Week   clonazePAM (KLONOPIN) 1 MG tablet Take 1 mg by mouth 3 times/day as needed-between meals & bedtime for anxiety.   04/07/2022   Fremanezumab-vfrm (AJOVY) 225 MG/1.5ML SOAJ Inject 1.5 mg into the skin every 28 (twenty-eight) days.      ibuprofen (ADVIL) 100 MG tablet Take 600 mg by mouth every 6 (six) hours as needed for fever.      omeprazole (PRILOSEC) 40 MG capsule TK ONE C PO QD  0 04/07/2022   Oxcarbazepine (TRILEPTAL) 300 MG tablet Take 300 mg by mouth 2 (two) times daily.   04/07/2022   PROAIR HFA 108 (90 Base) MCG/ACT inhaler Take 2 puffs by mouth 4 (four) times daily as needed.  1 Past Week   VIMPAT 200 MG TABS tablet Take 1 tablet by mouth 2 (two) times daily.  0 04/07/2022   ADVAIR DISKUS 250-50 MCG/DOSE AEPB Take 1 puff by mouth as needed.  0 04/05/2022   aspirin EC 81 MG tablet Take 81 mg by mouth daily. Swallow whole. (Patient not taking: Reported on 04/07/2022)   Not Taking   famotidine (PEPCID) 40 MG tablet TAKE 1 TABLET BY MOUTH AT BEDTIME (Patient not taking: Reported on 04/07/2022)   Not Taking    nortriptyline (PAMELOR) 25 MG capsule Take 2 capsules by mouth at bedtime as needed. (Patient not taking: Reported on 04/07/2022)  0 Not Taking   pantoprazole (PROTONIX) 40 MG tablet Take 40 mg by mouth daily. (Patient not taking: Reported on 04/07/2022)   Not Taking   tamsulosin (FLOMAX) 0.4 MG CAPS capsule Take 0.4 mg by mouth daily. (Patient not taking: Reported on 04/07/2022)   Not Taking   topiramate (TOPAMAX) 100 MG tablet TAKE 1 TABLET EVERY MORNING AND 2 TABLETS AT BEDTIME (Patient not taking: Reported on 04/07/2022)   Not Taking     Allergies  Allergen Reactions   Dilantin [Phenytoin]     seizures   Sulfa Antibiotics     Itching hives      Past Medical History:  Diagnosis Date   Asthma    Chronic pain disorder    Headache    Hypertension    Seizures     Review of systems:  Otherwise negative.    Physical Exam  Gen: Alert, oriented. Appears stated age.  HEENT: PERRLA. Lungs: No respiratory distress CV: RRR Abd: soft, benign, no masses Ext: No edema    Planned procedures: Proceed with colonoscopy. The patient understands the nature of the planned procedure, indications, risks, alternatives and potential complications including but not limited to bleeding, infection, perforation, damage to internal organs and possible oversedation/side effects from anesthesia.  The patient agrees and gives consent to proceed.  Please refer to procedure notes for findings, recommendations and patient disposition/instructions.     Lesly Rubenstein, MD [facility]

## 2022-04-07 NOTE — Anesthesia Preprocedure Evaluation (Signed)
Anesthesia Evaluation  Patient identified by MRN, date of birth, ID band Patient awake    Reviewed: Allergy & Precautions, H&P , NPO status , Patient's Chart, lab work & pertinent test results, reviewed documented beta blocker date and time   Airway Mallampati: II   Neck ROM: full    Dental  (+) Poor Dentition   Pulmonary asthma , Current SmokerPatient did not abstain from smoking.   Pulmonary exam normal        Cardiovascular Exercise Tolerance: Good hypertension, On Medications negative cardio ROS Normal cardiovascular exam Rhythm:regular Rate:Normal     Neuro/Psych  Headaches, Seizures -,   Neuromuscular disease  negative psych ROS   GI/Hepatic negative GI ROS, Neg liver ROS,,,  Endo/Other    Morbid obesity  Renal/GU negative Renal ROS  negative genitourinary   Musculoskeletal   Abdominal   Peds  Hematology negative hematology ROS (+)   Anesthesia Other Findings Past Medical History: No date: Asthma No date: Chronic pain disorder No date: Headache No date: Hypertension No date: Seizures Past Surgical History: No date: BACK SURGERY 06/05/2017: COLONOSCOPY WITH PROPOFOL; N/A     Comment:  Procedure: COLONOSCOPY WITH PROPOFOL;  Surgeon: Manya Silvas, MD;  Location: [facility] ENDOSCOPY;  Service:               Endoscopy;  Laterality: N/A; No date: cosmetic eye surgery 06/05/2017: ESOPHAGOGASTRODUODENOSCOPY (EGD) WITH PROPOFOL; N/A     Comment:  Procedure: ESOPHAGOGASTRODUODENOSCOPY (EGD) WITH               PROPOFOL;  Surgeon: Manya Silvas, MD;  Location:               [facility] ENDOSCOPY;  Service: Endoscopy;  Laterality: N/A; No date: FOOT SURGERY No date: NOSE SURGERY No date: VASECTOMY BMI    Body Mass Index: 39.05 kg/m     Reproductive/Obstetrics negative OB ROS                             Anesthesia Physical Anesthesia Plan  ASA: 3  Anesthesia Plan:  General   Post-op Pain Management:    Induction:   PONV Risk Score and Plan:   Airway Management Planned:   Additional Equipment:   Intra-op Plan:   Post-operative Plan:   Informed Consent: I have reviewed the patients History and Physical, chart, labs and discussed the procedure including the risks, benefits and alternatives for the proposed anesthesia with the patient or authorized representative who has indicated his/her understanding and acceptance.     Dental Advisory Given  Plan Discussed with: CRNA  Anesthesia Plan Comments:        Anesthesia Quick Evaluation

## 2022-04-07 NOTE — Interval H&P Note (Signed)
History and Physical Interval Note:  04/07/2022 12:39 PM  Mathew Robertson  has presented today for surgery, with the diagnosis of HX OF ADENOMATOUS POLYP OF COLON.  The various methods of treatment have been discussed with the patient and family. After consideration of risks, benefits and other options for treatment, the patient has consented to  Procedure(s): COLONOSCOPY WITH PROPOFOL (N/A) as a surgical intervention.  The patient's history has been reviewed, patient examined, no change in status, stable for surgery.  I have reviewed the patient's chart and labs.  Questions were answered to the patient's satisfaction.     Lesly Rubenstein  Ok to proceed with colonoscopy

## 2022-04-07 NOTE — Transfer of Care (Signed)
Immediate Anesthesia Transfer of Care Note  Patient: Mathew Robertson  Procedure(s) Performed: COLONOSCOPY WITH PROPOFOL  Patient Location: PACU  Anesthesia Type:General  Level of Consciousness: awake, drowsy, and patient cooperative  Airway & Oxygen Therapy: Patient Spontanous Breathing and Patient connected to nasal cannula oxygen  Post-op Assessment: Report given to RN and Post -op Vital signs reviewed and stable  Post vital signs: Reviewed and stable  Last Vitals:  Vitals Value Taken Time  BP    Temp    Pulse    Resp    SpO2      Last Pain:  Vitals:   04/07/22 1212  TempSrc: Temporal  PainSc: 0-No pain         Complications: No notable events documented.

## 2022-04-07 NOTE — Op Note (Signed)
[facility] Patient Name: Mathew Robertson Procedure Date: 04/07/2022 12:40 PM MRN: [id number] Account #: [id number] Date of Birth: 1965-03-12 Admit Type: Outpatient Age: 57 Room: [facility] ENDO ROOM 3 Gender: Male Note Status: Finalized Instrument Name: Park Meo [id number] Procedure:             Colonoscopy Indications:           Surveillance: Personal history of adenomatous polyps                         on last colonoscopy 5 years ago Providers:             Andrey Farmer MD, MD Referring MD:          No Local Md, MD (Referring MD) Medicines:             Monitored Anesthesia Care Complications:         No immediate complications. Estimated blood loss:                         Minimal. Procedure:             Pre-Anesthesia Assessment:                        - Prior to the procedure, a History and Physical was                         performed, and patient medications and allergies were                         reviewed. The patient is competent. The risks and                         benefits of the procedure and the sedation options and                         risks were discussed with the patient. All questions                         were answered and informed consent was obtained.                         Patient identification and proposed procedure were                         verified by the physician, the nurse, the                         anesthesiologist, the anesthetist and the technician                         in the endoscopy suite. Mental Status Examination:                         alert and oriented. Airway Examination: normal                         oropharyngeal airway and neck mobility. Respiratory  Examination: clear to auscultation. CV Examination:                         normal. Prophylactic Antibiotics: The patient does not                         require prophylactic antibiotics. Prior                          Anticoagulants: The patient has taken no anticoagulant                         or antiplatelet agents. ASA Grade Assessment: III - A                         patient with severe systemic disease. After reviewing                         the risks and benefits, the patient was deemed in                         satisfactory condition to undergo the procedure. The                         anesthesia plan was to use monitored anesthesia care                         (MAC). Immediately prior to administration of                         medications, the patient was re-assessed for adequacy                         to receive sedatives. The heart rate, respiratory                         rate, oxygen saturations, blood pressure, adequacy of                         pulmonary ventilation, and response to care were                         monitored throughout the procedure. The physical                         status of the patient was re-assessed after the                         procedure.                        After obtaining informed consent, the colonoscope was                         passed under direct vision. Throughout the procedure,                         the patient's blood pressure, pulse, and oxygen  saturations were monitored continuously. The                         Colonoscope was introduced through the anus and                         advanced to the the cecum, identified by appendiceal                         orifice and ileocecal valve. The colonoscopy was                         somewhat difficult due to significant looping.                         Successful completion of the procedure was aided by                         applying abdominal pressure. The patient tolerated the                         procedure well. The quality of the bowel preparation                         was adequate to identify polyps. The ileocecal valve,                          appendiceal orifice, and rectum were photographed. Findings:      The perianal and digital rectal examinations were normal.      A 3 mm polyp was found in the hepatic flexure. The polyp was sessile.       The polyp was removed with a cold snare. Resection and retrieval were       complete. Estimated blood loss was minimal.      A 10 mm polyp was found in the sigmoid colon. The polyp was       pedunculated. The polyp was removed with a hot snare. Resection and       retrieval were complete. To prevent bleeding post-intervention, one       hemostatic clip was successfully placed. There was no bleeding during,       or at the end, of the procedure.      Internal hemorrhoids were found during retroflexion. The hemorrhoids       were Grade I (internal hemorrhoids that do not prolapse).      The exam was otherwise without abnormality on direct and retroflexion       views. Impression:            - One 3 mm polyp at the hepatic flexure, removed with                         a cold snare. Resected and retrieved.                        - One 10 mm polyp in the sigmoid colon, removed with a                         hot snare. Resected and retrieved. Clip was placed.                        -  Internal hemorrhoids.                        - The examination was otherwise normal on direct and                         retroflexion views. Recommendation:        - Discharge patient to home.                        - Resume previous diet.                        - Continue present medications.                        - Await pathology results.                        - Repeat colonoscopy in 3 years for surveillance.                        - Return to referring physician as previously                         scheduled. Procedure Code(s):     --- Professional ---                        [phone], Colonoscopy, flexible; with removal of                         tumor(s), polyp(s), or other lesion(s) by snare                          technique Diagnosis Code(s):     --- Professional ---                        Z86.010, Personal history of colonic polyps                        D12.3, Benign neoplasm of transverse colon (hepatic                         flexure or splenic flexure)                        D12.5, Benign neoplasm of sigmoid colon                        K64.0, First degree hemorrhoids CPT copyright 2022 [employer]. All rights reserved. The codes documented in this report are preliminary and upon coder review may  be revised to meet current compliance requirements. Andrey Farmer MD, MD 04/07/2022 1:22:16 PM Number of Addenda: 0 Note Initiated On: 04/07/2022 12:40 PM Scope Withdrawal Time: 0 hours 18 minutes 44 seconds  Total Procedure Duration: 0 hours 25 minutes 33 seconds  Estimated Blood Loss:  Estimated blood loss was minimal.      [facility]

## 2022-04-08 ENCOUNTER — Encounter: Payer: Self-pay | Admitting: Gastroenterology

## 2022-04-08 LAB — SURGICAL PATHOLOGY

## 2022-04-09 NOTE — Anesthesia Postprocedure Evaluation (Signed)
Anesthesia Post Note  Patient: Mathew Robertson  Procedure(s) Performed: COLONOSCOPY WITH PROPOFOL  Patient location during evaluation: PACU Anesthesia Type: General Level of consciousness: awake and alert Pain management: pain level controlled Vital Signs Assessment: post-procedure vital signs reviewed and stable Respiratory status: spontaneous breathing, nonlabored ventilation, respiratory function stable and patient connected to nasal cannula oxygen Cardiovascular status: blood pressure returned to baseline and stable Postop Assessment: no apparent nausea or vomiting Anesthetic complications: no   No notable events documented.   Last Vitals:  Vitals:   04/07/22 1330 04/07/22 1340  BP: 123/84 (!) 144/88  Pulse: 71 64  Resp: 20 18  Temp:    SpO2: 98% 96%    Last Pain:  Vitals:   04/08/22 0743  TempSrc:   PainSc: 0-No pain                 Molli Barrows

## 2022-05-13 ENCOUNTER — Encounter: Payer: Self-pay | Admitting: Gastroenterology

## 2022-06-09 ENCOUNTER — Ambulatory Visit: Payer: Medicaid Other | Admitting: Podiatry

## 2022-06-09 ENCOUNTER — Encounter: Payer: Self-pay | Admitting: Podiatry

## 2022-06-09 DIAGNOSIS — B351 Tinea unguium: Secondary | ICD-10-CM

## 2022-06-09 DIAGNOSIS — M79674 Pain in right toe(s): Secondary | ICD-10-CM | POA: Diagnosis not present

## 2022-06-09 DIAGNOSIS — M79675 Pain in left toe(s): Secondary | ICD-10-CM

## 2022-06-12 NOTE — Progress Notes (Signed)
  Subjective:  Patient ID: Mathew Robertson, male    DOB: 1965/03/23,  MRN: [id number]  Mathew Robertson presents to clinic today for: callus(es) b/l lower extremities and painful thick toenails that are difficult to trim. Painful toenails interfere with ambulation. Aggravating factors include wearing enclosed shoe gear. Pain is relieved with periodic professional debridement. Painful calluses are aggravated when weightbearing with and without shoegear. Pain is relieved with periodic professional debridement.  Chief Complaint  Patient presents with   Nail Problem    Referring Provider RFC,[facility],LOV:04/24      Callouses    trim    PCP is [facility].  Allergies  Allergen Reactions   Dilantin [Phenytoin]     seizures   Sulfa Antibiotics     Itching hives     Review of Systems: Negative except as noted in the HPI.  Objective: No changes noted in today's physical examination. There were no vitals filed for this visit.  Mathew Robertson is a pleasant 57 y.o. male in NAD. AAO x 3.  Vascular Examination: Capillary refill time <3 seconds b/l LE. Palpable pedal pulses b/l LE. Digital hair present b/l. No pedal edema b/l. Skin temperature gradient WNL b/l. No varicosities b/l. Marland Kitchen  Dermatological Examination: Pedal skin with normal turgor, texture and tone b/l. No open wounds. No interdigital macerations b/l. Toenails 1-4 left and R 1, 2, 4, 5 thickened, discolored, dystrophic with subungual debris. There is pain on palpation to dorsal aspect of nailplates. Hyperkeratotic lesion(s) ball of both feet and bilateral heels.  No erythema, no edema, no drainage, no fluctuance..  Neurological Examination: Protective sensation intact with 10 gram monofilament b/l LE. Vibratory sensation intact b/l LE.   Musculoskeletal Examination: Normal muscle strength 5/5 to all lower extremity muscle groups bilaterally. Hammertoe deformity noted 2-5 b/l.Marland Kitchen No  pain, crepitus or joint limitation noted with ROM b/l LE.  Patient ambulates independently without assistive aids.  Assessment/Plan: 1. Pain due to onychomycosis of toenails of both feet    -Patient was evaluated and treated. All patient's and/or POA's questions/concerns answered on today's visit. -Patient's family member present. All questions/concerns addressed on today's visit. -Discussed callus care. After bath/shower once weekly use pumice stone on calluses followed by application of moisturizer. -Patient to continue soft, supportive shoe gear daily. -Toenails were debrided in length and girth 1-4 left foot, R hallux, R 2nd toe, R 4th toe, and R 5th toe with sterile nail nippers and dremel without iatrogenic bleeding.  -As a courtesy, callus(es) ball tylomas b/l and bilateral heels gently filed without complication or incident. Total number pared=4. -Patient/POA to call should there be question/concern in the interim.   Return in about 3 months (around 09/09/2022).  Freddie Breech, DPM

## 2022-09-19 ENCOUNTER — Ambulatory Visit: Payer: Medicaid Other | Admitting: Podiatry

## 2022-11-03 ENCOUNTER — Institutional Professional Consult (permissible substitution): Payer: Medicaid Other | Admitting: Pulmonary Disease

## 2022-12-08 ENCOUNTER — Other Ambulatory Visit: Payer: Self-pay | Admitting: Physician Assistant

## 2022-12-08 DIAGNOSIS — R569 Unspecified convulsions: Secondary | ICD-10-CM

## 2022-12-08 DIAGNOSIS — R296 Repeated falls: Secondary | ICD-10-CM

## 2022-12-08 DIAGNOSIS — R519 Headache, unspecified: Secondary | ICD-10-CM

## 2022-12-08 DIAGNOSIS — M542 Cervicalgia: Secondary | ICD-10-CM

## 2022-12-16 ENCOUNTER — Encounter: Payer: Self-pay | Admitting: Physician Assistant

## 2022-12-20 ENCOUNTER — Other Ambulatory Visit: Payer: Medicaid Other

## 2022-12-29 ENCOUNTER — Ambulatory Visit: Payer: Medicaid Other | Admitting: Physical Therapy

## 2023-01-02 ENCOUNTER — Ambulatory Visit: Payer: Medicaid Other | Admitting: Physical Therapy

## 2023-01-03 ENCOUNTER — Other Ambulatory Visit: Payer: Medicaid Other

## 2023-01-03 ENCOUNTER — Inpatient Hospital Stay: Admission: RE | Admit: 2023-01-03 | Payer: Medicaid Other | Source: Ambulatory Visit

## 2023-01-06 ENCOUNTER — Ambulatory Visit: Payer: Medicaid Other | Admitting: Physical Therapy

## 2023-01-09 ENCOUNTER — Ambulatory Visit: Payer: Medicaid Other | Admitting: Physical Therapy

## 2023-01-12 ENCOUNTER — Ambulatory Visit: Payer: Medicaid Other | Admitting: Physical Therapy

## 2023-01-13 NOTE — Therapy (Deleted)
 OUTPATIENT PHYSICAL THERAPY NEURO EVALUATION   Patient Name: Mathew Robertson MRN: [id number] DOB:[birth year], 58 y.o., male Today's Date: 01/13/2023   PCP: [facility] per chart   REFERRING PROVIDER:   Lane Arthea BRAVO, MD    END OF SESSION:   Past Medical History:  Diagnosis Date   Asthma    Chronic pain disorder    Headache    Hypertension    Seizures (HCC)    Past Surgical History:  Procedure Laterality Date   BACK SURGERY     COLONOSCOPY WITH PROPOFOL  N/A 06/05/2017   Procedure: COLONOSCOPY WITH PROPOFOL ;  Surgeon: Viktoria Lamar DASEN, MD;  Location[facility]COPY;  Service: Endoscopy;  Laterality: N/A;   COLONOSCOPY WITH PROPOFOL  N/A 04/07/2022   Procedure: COLONOSCOPY WITH PROPOFOL ;  Surgeon: Maryruth Ole DASEN, MD;  Location: [facility] ENDOSCOPY;  Service: Endoscopy;  Laterality: N/A;   cosmetic eye surgery     ESOPHAGOGASTRODUODENOSCOPY (EGD) WITH PROPOFOL  N/A 06/05/2017   Procedure: ESOPHAGOGASTRODUODENOSCOPY (EGD) WITH PROPOFOL ;  Surgeon: Viktoria Lamar DASEN, MD;  Location: [facility] ENDOSCOPY;  Service: Endoscopy;  Laterality: N/A;   FOOT SURGERY     NOSE SURGERY     VASECTOMY     Patient Active Problem List   Diagnosis Date Noted   Pain due to onychomycosis of toenails of both feet 02/14/2019   Right cervical radiculopathy 09/17/2017   Foraminal stenosis of cervical region (R) 09/17/2017   Osteoarthritis of spine with radiculopathy, cervical region 09/17/2017   Cervicalgia 08/25/2017   Chronic bilateral low back pain with bilateral sciatica 12/23/2016   Lumbar degenerative disc disease 12/23/2016   Chronic pain syndrome 12/23/2016    ONSET DATE: ***  REFERRING DIAG:  Diagnosis  R26.2 (ICD-10-CM) - Difficulty walking  R26.89 (ICD-10-CM) - Imbalance    THERAPY DIAG:  No diagnosis found.  Rationale for Evaluation and Treatment: {HABREHAB:27488}  SUBJECTIVE:                                                                                                                                                                                              SUBJECTIVE STATEMENT: *** Pt accompanied by: {accompnied:27141}  PERTINENT HISTORY: ***  PAIN:  Are you having pain? {OPRCPAIN:27236}  PRECAUTIONS: {Therapy precautions:24002}  RED FLAGS: {PT Red Flags:29287}   WEIGHT BEARING RESTRICTIONS: {Yes ***/No:24003}  FALLS: Has patient fallen in last 6 months? {fallsyesno:27318}  LIVING ENVIRONMENT: Lives with: {OPRC lives with:25569::lives with their family} Lives in: {Lives in:25570} Stairs: {opstairs:27293} Has following equipment at home: {Assistive devices:23999}  PLOF: {PLOF:24004}  PATIENT GOALS: ***  OBJECTIVE:  Note: Objective measures were completed at Evaluation unless otherwise noted.  DIAGNOSTIC FINDINGS: ***  COGNITION: Overall cognitive status: {cognition:24006}   SENSATION: {sensation:27233}  COORDINATION: ***  EDEMA:  {edema:24020}  MUSCLE TONE: {LE tone:25568}  MUSCLE LENGTH: Hamstrings: Right *** deg; Left *** deg Debby test: Right *** deg; Left *** deg  DTRs:  {DTR SITE:24025}  POSTURE: {posture:25561}  LOWER EXTREMITY ROM:     {AROM/PROM:27142}  Right Eval Left Eval  Hip flexion    Hip extension    Hip abduction    Hip adduction    Hip internal rotation    Hip external rotation    Knee flexion    Knee extension    Ankle dorsiflexion    Ankle plantarflexion    Ankle inversion    Ankle eversion     (Blank rows = not tested)  LOWER EXTREMITY MMT:    MMT Right Eval Left Eval  Hip flexion    Hip extension    Hip abduction    Hip adduction    Hip internal rotation    Hip external rotation    Knee flexion    Knee extension    Ankle dorsiflexion    Ankle plantarflexion    Ankle inversion    Ankle eversion    (Blank rows = not tested)  BED MOBILITY:  {Bed mobility:24027}  TRANSFERS: Assistive device utilized: {Assistive devices:23999}  Sit to stand: {Levels  of assistance:24026} Stand to sit: {Levels of assistance:24026} Chair to chair: {Levels of assistance:24026} Floor: {Levels of assistance:24026}  RAMP:  Level of Assistance: {Levels of assistance:24026} Assistive device utilized: {Assistive devices:23999} Ramp Comments: ***  CURB:  Level of Assistance: {Levels of assistance:24026} Assistive device utilized: {Assistive devices:23999} Curb Comments: ***  STAIRS: Level of Assistance: {Levels of assistance:24026} Stair Negotiation Technique: {Stair Technique:27161} with {Rail Assistance:27162} Number of Stairs: ***  Height of Stairs: ***  Comments: ***  GAIT: Gait pattern: {gait characteristics:25376} Distance walked: *** Assistive device utilized: {Assistive devices:23999} Level of assistance: {Levels of assistance:24026} Comments: ***  FUNCTIONAL TESTS:  {Functional tests:24029}  PATIENT SURVEYS:  FOTO ***                                                                                                                              TREATMENT DATE: ***    PATIENT EDUCATION: Pt educated throughout session about proper posture and technique with exercises. Improved exercise technique, movement at target joints, use of target muscles after min to mod verbal, visual, tactile cues.   GOALS: Goals reviewed with patient? {yes/no:20286}  SHORT TERM GOALS: Target date: ***     Patient will be independent in home exercise program to improve strength/mobility for better functional independence with ADLs. Baseline: No HEP currently  Goal status: INITIAL   LONG TERM GOALS: Target date: ***  ***.  Patient (> 32 years old) will complete five times sit to stand test in < 15 seconds indicating an increased LE strength and improved balance. Baseline: *** Goal status: INITIAL  ***.  Patient will  increase FOTO score to equal to or greater than  ***   to demonstrate statistically significant improvement in mobility and quality of  life.  Baseline: *** Goal status: INITIAL   ***.  Patient will increase Berg Balance score by > 6 points to demonstrate decreased fall risk during functional activities. Baseline: *** Goal status: INITIAL   ***.   Patient will reduce timed up and go to <11 seconds to reduce fall risk and demonstrate improved transfer/gait ability. Baseline: *** Goal status: INITIAL  ***.   Patient will increase 10 meter walk test to >1.76m/s as to improve gait speed for better community ambulation and to reduce fall risk. Baseline: *** Goal status: INITIAL  ***.   Patient will increase six minute walk test distance to >1000 for progression to community ambulator and improve gait ability Baseline: *** Goal status: INITIAL    ASSESSMENT:  CLINICAL IMPRESSION: Patient is a *** y.o. *** who was seen today for physical therapy evaluation and treatment for ***.   OBJECTIVE IMPAIRMENTS: {opptimpairments:25111}.   ACTIVITY LIMITATIONS: {activitylimitations:27494}  PARTICIPATION LIMITATIONS: {participationrestrictions:25113}  PERSONAL FACTORS: {Personal factors:25162} are also affecting patient's functional outcome.   REHAB POTENTIAL: {rehabpotential:25112}  CLINICAL DECISION MAKING: {clinical decision making:25114}  EVALUATION COMPLEXITY: {Evaluation complexity:25115}  PLAN:  PT FREQUENCY: {rehab frequency:25116}  PT DURATION: {rehab duration:25117}  PLANNED INTERVENTIONS: {rehab planned interventions:25118::97110-Therapeutic exercises,97530- Therapeutic [phone]- Neuromuscular re-education,97535- Self Rjmz,02859- Manual therapy}  PLAN FOR NEXT SESSION: PIERRETTE Lonni KATHEE Ebb, PT 01/13/2023, 4:05 PM

## 2023-01-15 ENCOUNTER — Ambulatory Visit: Payer: Medicaid Other | Attending: Neurology | Admitting: Physical Therapy

## 2023-01-15 ENCOUNTER — Ambulatory Visit: Payer: Medicaid Other | Admitting: Physical Therapy

## 2023-01-19 ENCOUNTER — Ambulatory Visit: Payer: Medicaid Other | Admitting: Physical Therapy

## 2023-01-20 ENCOUNTER — Ambulatory Visit
Admission: RE | Admit: 2023-01-20 | Discharge: 2023-01-20 | Disposition: A | Discharge status: Home / Self Care | Payer: Medicaid Other | Source: Ambulatory Visit | Attending: Physician Assistant | Admitting: Physician Assistant

## 2023-01-20 DIAGNOSIS — M542 Cervicalgia: Secondary | ICD-10-CM

## 2023-01-20 DIAGNOSIS — R296 Repeated falls: Secondary | ICD-10-CM

## 2023-01-20 DIAGNOSIS — R569 Unspecified convulsions: Secondary | ICD-10-CM

## 2023-01-20 DIAGNOSIS — R519 Headache, unspecified: Secondary | ICD-10-CM

## 2023-01-21 ENCOUNTER — Ambulatory Visit: Payer: Medicaid Other | Admitting: Physical Therapy

## 2023-01-26 ENCOUNTER — Ambulatory Visit: Payer: Medicaid Other | Admitting: Physical Therapy

## 2023-01-28 ENCOUNTER — Ambulatory Visit: Payer: Medicaid Other | Admitting: Physical Therapy

## 2023-02-02 ENCOUNTER — Ambulatory Visit: Payer: Medicaid Other | Admitting: Physical Therapy

## 2023-02-04 ENCOUNTER — Ambulatory Visit: Payer: Medicaid Other | Admitting: Physical Therapy

## 2023-02-09 ENCOUNTER — Ambulatory Visit: Payer: Medicaid Other | Admitting: Physical Therapy

## 2023-02-11 ENCOUNTER — Ambulatory Visit: Payer: Medicaid Other | Admitting: Physical Therapy

## 2023-02-16 ENCOUNTER — Ambulatory Visit: Payer: Medicaid Other | Admitting: Physical Therapy

## 2023-02-18 ENCOUNTER — Ambulatory Visit: Payer: Medicaid Other | Admitting: Physical Therapy

## 2023-02-23 ENCOUNTER — Ambulatory Visit: Payer: Medicaid Other | Admitting: Physical Therapy

## 2023-02-25 ENCOUNTER — Ambulatory Visit: Payer: Medicaid Other | Admitting: Physical Therapy

## 2023-03-02 ENCOUNTER — Ambulatory Visit: Payer: Medicaid Other | Admitting: Physical Therapy

## 2023-03-04 ENCOUNTER — Ambulatory Visit: Payer: Medicaid Other | Admitting: Physical Therapy

## 2023-03-09 ENCOUNTER — Ambulatory Visit: Payer: Medicaid Other | Admitting: Physical Therapy

## 2023-03-11 ENCOUNTER — Ambulatory Visit: Payer: Medicaid Other | Admitting: Physical Therapy

## 2023-03-16 ENCOUNTER — Ambulatory Visit: Payer: Medicaid Other | Admitting: Physical Therapy

## 2023-03-18 ENCOUNTER — Ambulatory Visit: Payer: Medicaid Other | Admitting: Physical Therapy

## 2023-03-23 ENCOUNTER — Ambulatory Visit: Payer: Medicaid Other | Admitting: Physical Therapy

## 2023-03-25 ENCOUNTER — Ambulatory Visit: Payer: Medicaid Other | Admitting: Physical Therapy

## 2023-03-30 ENCOUNTER — Ambulatory Visit: Payer: Medicaid Other | Admitting: Physical Therapy
